# Patient Record
Sex: Female | Born: 1960 | Race: White | Hispanic: No | Marital: Married | Smoking: Never smoker
Health system: Southern US, Academic
[De-identification: ages and names within clinical notes are randomized; demographics above are authoritative.]

## PROBLEM LIST (undated history)

## (undated) DIAGNOSIS — I10 Essential (primary) hypertension: Secondary | ICD-10-CM

## (undated) DIAGNOSIS — J383 Other diseases of vocal cords: Secondary | ICD-10-CM

## (undated) DIAGNOSIS — E782 Mixed hyperlipidemia: Secondary | ICD-10-CM

## (undated) DIAGNOSIS — R011 Cardiac murmur, unspecified: Secondary | ICD-10-CM

## (undated) DIAGNOSIS — F419 Anxiety disorder, unspecified: Secondary | ICD-10-CM

## (undated) DIAGNOSIS — D649 Anemia, unspecified: Secondary | ICD-10-CM

## (undated) DIAGNOSIS — E559 Vitamin D deficiency, unspecified: Secondary | ICD-10-CM

## (undated) DIAGNOSIS — J40 Bronchitis, not specified as acute or chronic: Secondary | ICD-10-CM

## (undated) HISTORY — DX: Vitamin D deficiency, unspecified: E55.9

## (undated) HISTORY — DX: Cardiac murmur, unspecified: R01.1

## (undated) HISTORY — DX: Essential (primary) hypertension: I10

## (undated) HISTORY — PX: ANAL FISTULOTOMY: SHX1139

## (undated) HISTORY — DX: Other diseases of vocal cords: J38.3

## (undated) HISTORY — PX: HX APPENDECTOMY: SHX54

## (undated) HISTORY — DX: Anxiety disorder, unspecified: F41.9

## (undated) HISTORY — DX: Mixed hyperlipidemia: E78.2

## (undated) HISTORY — DX: Anemia, unspecified: D64.9

## (undated) HISTORY — PX: HX TONSILLECTOMY: SHX27

## (undated) HISTORY — PX: ENDOMETRIAL ABLATION W/ NOVASURE: SUR434

## (undated) HISTORY — PX: HX TUBAL LIGATION: SHX77

## (undated) HISTORY — PX: KNEE ARTHROSCOPY: SUR90

## (undated) HISTORY — DX: Bronchitis, not specified as acute or chronic: J40

---

## 1992-06-29 ENCOUNTER — Other Ambulatory Visit (HOSPITAL_COMMUNITY): Payer: Self-pay

## 2010-10-10 ENCOUNTER — Ambulatory Visit (INDEPENDENT_AMBULATORY_CARE_PROVIDER_SITE_OTHER): Payer: BC Managed Care – PPO | Admitting: Otolaryngology

## 2010-10-13 ENCOUNTER — Encounter (INDEPENDENT_AMBULATORY_CARE_PROVIDER_SITE_OTHER): Payer: Self-pay | Admitting: Otolaryngology

## 2010-10-13 ENCOUNTER — Ambulatory Visit: Payer: BC Managed Care – PPO | Attending: Otolaryngology | Admitting: Otolaryngology

## 2010-10-13 VITALS — BP 132/82 | HR 59 | Temp 98.2°F | Ht 65.0 in | Wt 185.0 lb

## 2010-10-13 DIAGNOSIS — J387 Other diseases of larynx: Secondary | ICD-10-CM | POA: Insufficient documentation

## 2010-10-13 NOTE — H&P (Addendum)
PATIENT NAME:  Courtney Maynard  MRN:  [id number]  DOB:  12-07-1960  DATE OF SERVICE: 10/13/2010    Chief Complaint:  Elpidio Eric      HPI:  Courtney Maynard is a 50 y.o. 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 Noelle Penner for the past twelve years. Dr. Noelle Penner is moving his practice to the [place] and she needs to find a new physician to provide the injections. She has been getting the injections approximately every 4 months. She had previously been getting injections every 6 months. Her most recent injection was in 05/2010. She has tried speech therapy in the past for this but benefits most from the botox injections. She has had a tonsillectomy in the past but has not had any other surgery on her head and neck. She has no other complaints at this time.        Past Medical History:  History reviewed. No pertinent past medical history.    Past Surgical History:  Past Surgical History   Procedure Date   . Hx tonsillectomy        Family History:  Family History   Problem Relation Age of Onset   . Heart Disease Mother    . Hypertension Mother    . Cancer Father    . Diabetes Father    . Heart Disease Father    . Hypertension Father    . Cancer Sister    . Diabetes Sister    . Hypertension Sister    . Cancer Brother    . Heart Disease Brother    . Hypertension Brother        Social History:  History   Smoking status   . Never Smoker    Smokeless tobacco   . Never Used     History   Alcohol Use No     Social History     Occupational History   . Not on file.       Medications:  Outpatient Prescriptions Marked as Taking for the 10/13/10 encounter (Office Visit) with Festus Holts, MD   Medication Sig   . Phentermine 37.5 mg Oral Capsule take 37.5 mg by mouth Once a day.     . MULTIVIT WITH CALCIUM,IRON,MIN [facility] MULTIPLE VITAMINS ORAL) take  by mouth.      . Fexofenadine (ALLEGRA) 30 mg Oral Tablet take 180 mg by mouth Twice daily.     . Naproxen 500 mg Oral Tablet, Delayed Release (E.C.) take 500 mg by mouth Twice daily.         Allergies:  No Known Allergies    Review of Systems:  Do you have any fevers: no   Any weight change: yes Explain Weight Change: diet Change in your vision: no    Chest Pain: no   Shortness of Breath: no   Stomach pain: no   Urinary difficulity: no   Joint Pain: yes Explain Joint Pain: right foot Skin Problems: no   Weakness or Numbness: no   Easy Bruising or Bleeding: no   Excessive Thirst: no   Seasonal Allergies: yes Explain Seasonal Allergies: all  All other systems reviewed and found to be negative.    Physical Exam:  Blood pressure 132/82, pulse 59, temperature 36.8 C (98.2 F), height 1.651 m (5\' 5" ), weight 83.915 kg (185 lb).  Body mass index is 30.79 kg/(m^2).  General Appearance: Pleasant, cooperative, healthy,  and in no acute distress.  Eyes: Conjunctivae/corneas clear  Head and Face: Normocephalic, atraumatic.  Face symmetric, no obvious lesions.   Pinnae: Normal shape and position.   External auditory canals:  Patent without inflammation.  Tympanic membranes:  Intact, translucent, midposition, middle ear aerated.  Nose:  External pyramid midline. Septum midline. Mucosa normal. No purulence, polyps, or crusts.   Oral Cavity/Oropharynx: No mucosal lesions, masses, or pharyngeal asymmetry.  Hypopharynx/Larynx: See procedure note  Neck:  No palpable thyroid, salivary gland, or neck masses.  Heme/Lymph:  No cervical adenopathy.  Cardiovascular:  Good perfusion of upper extremities.  No cyanosis of the hands or fingers.  Lungs: No apparent stridorous breathing. No acute distress.  Skin: Skin warm and dry.  Neurologic: Cranial nerves:  grossly intact.      Procedure:  OTOLARYGOLOGY PROCEDURE NOTE      DATE OF SERVICE: 10/13/2010  PATIENT NAME:Angelia Joyce Gross Wyant  DATE OF BIRTH: 10/13/1960       Procedure:  Fiberoptic Trans-nasal Laryngoscopy  Operator: Hassell Done  Asst: Jenean Lindau  Anesthesia:  Topical  Findings: Visualization of the hypopharynx/larynx with mirror not adequate for examination and fiberoptic examination performed.  After the nasal cavity was anesthetized/decongested with topical pontocaine and neosynephrine, flexible laryngoscope was passed.     Nasopharynx had normal mucosa and no lesions of the nasopharyngeal walls.  The Eustachian tube orifices were normal.     Hypopharynx:  No lesions of the epiglottis, posterior pharyngeal walls or piriform mucosa.  There is no pooling of secretions.    Larynx: The vocal cords are mobile and adduct to midline bilaterally. There is tremor of the vocal cords when the patient makes the "e" sound. There are no lesions.   There is good abduction with sniff.  The airway is patent.          Data Reviewed: Medical records from Dr. Noelle Penner' office indicate that her last injection was on 06/10/2010 and consisted of 1.25 Units of botox in 0.1 cc into each vocal cord      Assessment:  1. 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 FLEX LARYNGOSCOPY DIAGNOSTIC (AMB ONLY)       1. She wishes to establish care with [place] for botox injections to manage her spasmodic dysphonia. She wishes to have the injections approved through her insurance company before she starts. We will have her meet with our insurance people and tentatively schedule her  2. We will plan to see her back on the next botox injection day pending approval with her insurance        Preston Fleeting, MD  PGY-2  [place] [facility] of Otolaryngology  Head and Neck Surgery    See resident's note for details. I saw and examined the patient and agree with the resident's findings and plan as written except as noted  and: I was present and supervised/observed the entire laryngoscopy procedure.    Festus Holts, MD 10/17/2010, 3:37 PM    [place] [facility] of Otolaryngology  Head and Neck Surgery

## 2010-10-13 NOTE — Procedures (Addendum)
OTOLARYGOLOGY PROCEDURE NOTE      DATE OF SERVICE: 10/13/2010  PATIENT NAME:Courtney Maynard  DATE OF BIRTH: [birth year]      Procedure:  Fiberoptic Trans-nasal Laryngoscopy  Operator: Hassell Done  Asst: Jenean Lindau  Anesthesia:  Topical  Findings: Visualization of the hypopharynx/larynx with mirror not adequate for examination and fiberoptic examination performed.  After the nasal cavity was anesthetized/decongested with topical pontocaine and neosynephrine, flexible laryngoscope was passed.     Nasopharynx had normal mucosa and no lesions of the nasopharyngeal walls.  The Eustachian tube orifices were normal.     Hypopharynx:  No lesions of the epiglottis, posterior pharyngeal walls or piriform mucosa.  There is no pooling of secretions.    Larynx: The vocal cords are mobile and adduct to midline bilaterally. There is tremor of the vocal cords when the patient makes the "e" sound. There are no lesions.   There is good abduction with sniff.  The airway is patent.      I was present and supervised/observed the entire procedure.  Festus Holts, MD 10/17/2010, 3:37 PM

## 2010-10-17 ENCOUNTER — Ambulatory Visit: Payer: BC Managed Care – PPO | Attending: Otolaryngology | Admitting: Otolaryngology

## 2010-10-17 DIAGNOSIS — R490 Dysphonia: Secondary | ICD-10-CM | POA: Insufficient documentation

## 2010-10-17 DIAGNOSIS — J387 Other diseases of larynx: Secondary | ICD-10-CM | POA: Insufficient documentation

## 2010-10-17 NOTE — Procedures (Addendum)
OTOLARYGOLOGY PROCEDURE NOTE      DATE OF SERVICE: 10/17/2010  PATIENT NAME:Courtney Maynard  DATE OF BIRTH: [birth year]        Procedure:       Laryngeal Botox Injection    Pre/Postoperative Diagnosis: Spasmodic Dysphonia    Surgeon:         Festus Holts, MD    Procedure: Patient was brought into the Botox Suite and placed in semi-recumbent position.  The head was placed in a slightly extended position.  All landmarks were easily palpated and the skin was prepped with alcohol.  0.4mL of 1% lidocaine with 1:100,000 Epinephrine was injected to the subcutaneous smin over the cricothyroid membrane and through the membrane into the subglottic airway.  Using a 27-gauge Teflon coated needle under EMG guidance, 1.25 units of Botox in 0.05  mL were injected into the Right TA muscle.  Good EMG signal was obtained.  Next, 1.25 Units of Botox in 0.05 mL were injected into the Left TA muscle.  Again, good EMG signal was obtained.  The patient tolerated the procedure well.   All discharge instructions were thoroughly explained to the patient and all questions answered.      Lot # [id number]  Exp. 03/2013    Festus Holts, MD 10/17/2010, 2:53 PM

## 2010-10-17 NOTE — Progress Notes (Signed)
Courtney Maynard is a 50 y.o. female who returns for Botox injection in management of her spasmodic dysphonia.  She had been managed previously with 1.25 Units bilaterally in [place].  Will procede with first injection with [place] today.    Festus Holts, MD 10/17/2010, 2:50 PM

## 2011-01-09 ENCOUNTER — Ambulatory Visit: Payer: BC Managed Care – PPO | Attending: Otolaryngology | Admitting: Otolaryngology

## 2011-01-09 DIAGNOSIS — R490 Dysphonia: Secondary | ICD-10-CM | POA: Insufficient documentation

## 2011-01-09 MED ORDER — LEVOFLOXACIN 500 MG TABLET
500.0000 mg | ORAL_TABLET | Freq: Every day | ORAL | Status: DC
Start: 2011-01-09 — End: 2012-02-19

## 2011-01-09 NOTE — Procedures (Addendum)
OTOLARYGOLOGY PROCEDURE NOTE      DATE OF SERVICE: 01/09/2011  PATIENT NAME:Courtney Maynard  DATE OF BIRTH: September 25, 1960        Procedure:       Laryngeal Botox Injection    Pre/Postoperative Diagnosis: Spasmodic Dysphonia    Surgeon:         Festus Holts, MD    Procedure: Patient was brought into the Botox Suite and placed in semi-recumbent position.  The head was placed in a slightly extended position.  All landmarks were easily palpated and the skin was prepped with alcohol.   The skin over the cricothyroid membrane and the airway were anesthetized with a total of 0.42mL 1% lido with 1:100,000 epi.  Using a 27-gauge Teflon coated needle under EMG guidance, 1.25 units of Botox in 0.05 mL were injected into the Right TA muscle.  Good EMG signal was obtained.  Next, 1.25 Units of Botox in 0.05 mL were injected into the Left TA muscle.  Again, good EMG signal was obtained.  The patient tolerated the procedure well.   All discharge instructions were thoroughly explained to the patient and all questions answered.    Lot#   [id number]  Exp: April 2015    Festus Holts, MD 01/09/2011, 1:39 PM

## 2011-01-09 NOTE — Progress Notes (Signed)
CC: Return for botox injection - spasmodic dysphonia   Sinus infection    HPI: [name] is a 50 y.o. female who had her most recent botox injection on 10/17/2010.  She was not as breathy afterward and it didn't last as long as she is used to.  She feels like she could have gotten an injection last month.  She also notes a recent problem with sinus symptoms.  This was mostly facial pressure and pain and typical for prior sinus infections she has had.  This started after smoke exposure.  She was treated with Augmentin and the Z-pack without improvement.  No other new complaints.  She does take flonase and allegra.    No past medical history on file.     Past Surgical History   Procedure Date   . Hx tonsillectomy       Current Outpatient Prescriptions   Medication Sig   . MULTIVIT WITH CALCIUM,IRON,MIN [facility] MULTIPLE VITAMINS ORAL) take  by mouth.     . Fexofenadine (ALLEGRA) 30 mg Oral Tablet take 180 mg by mouth Twice daily.     . Naproxen 500 mg Oral Tablet, Delayed Release (E.C.) take 500 mg by mouth Twice daily.     Marland Kitchen DISCONTD: Phentermine 37.5 mg Oral Capsule take 37.5 mg by mouth Once a day.        No Known Allergies     Exam:  BP 126/80   Pulse 96   Temp 37 C (98.6 F)   Ht 1.651 m (5\' 5" )   Wt 86.637 kg (191 lb)   BMI 31.78 kg/m2   General: Age appropriate female seated and in no acute distress.   Vocal tremor appreciated.   Head:  Atraumatic, normocephalic, no lesions   Eyes:  Palpebral fissures equal bilaterally, sclerae anicteric.   Nose:  No external lesions.  The nares are patent.  The septum is intact.  No polyps or purulence noted.  Oral Cavity: No lesions of the gingiva, floor of mouth, tongue or palate.     Oropharynx: No lesions of the tongue base, soft palate, or pharyngeal walls.  No postnasal drainage.      Impression: Spasmodic dysphonia    Maxillary sinusitis     Plan:  For botox injection today.  Will keep 1.25 Unit dose.  If same effect after this injection would consider increase.  Will prescribe 10 days of levaquin with one refill if needed.  If not improving will consider imaging.    Festus Holts, MD 01/09/2011, 1:34 PM

## 2011-01-16 ENCOUNTER — Ambulatory Visit (INDEPENDENT_AMBULATORY_CARE_PROVIDER_SITE_OTHER): Payer: BC Managed Care – PPO | Admitting: Otolaryngology

## 2011-02-02 ENCOUNTER — Ambulatory Visit (INDEPENDENT_AMBULATORY_CARE_PROVIDER_SITE_OTHER): Payer: Self-pay | Admitting: Otolaryngology

## 2011-02-02 NOTE — Telephone Encounter (Signed)
Message copied by Lauro Regulus on Thu Feb 02, 2011  4:16 PM  ------       Message from: ICE, SHERRY       Created: Thu Feb 02, 2011  3:07 PM       Regarding: RE: PT EXPERIENCING SPEECH LOOSE. HAD INJECTION 01/09/11         >> SHERRY ICE 02/02/2011 03:07 PM       DR. MCCHESNEY PT              PT CALLED AND STATES THAT THE INJECTION SHE HAD ON December 10TH, SEEMS NOT TO BE WORKING AS WELL AS LAST ONE . THE PT IS EXPERIENCING PROBLEMS WITH SPEECH LOOSE                     PLEASE CALL PT BACK TO DISCUSS WHAT TO DO              THANKS       SHERRY

## 2011-02-02 NOTE — Telephone Encounter (Signed)
Dr. Hassell Done had me take to [facility] to get her scheduled for another injection.

## 2011-02-20 ENCOUNTER — Ambulatory Visit: Payer: BC Managed Care – PPO | Attending: Otolaryngology | Admitting: Otolaryngology

## 2011-02-20 DIAGNOSIS — R490 Dysphonia: Secondary | ICD-10-CM | POA: Insufficient documentation

## 2011-02-20 DIAGNOSIS — J387 Other diseases of larynx: Secondary | ICD-10-CM | POA: Insufficient documentation

## 2011-02-20 NOTE — Procedures (Signed)
OTOLARYGOLOGY PROCEDURE NOTE      DATE OF SERVICE: 02/20/2011  PATIENT NAME:Courtney Maynard  DATE OF BIRTH: 08/23/1960        Procedure:       Laryngeal Botox Injection    Pre/Postoperative Diagnosis: Spasmodic Dysphonia    Surgeon:         Festus Holts, MD    Procedure: Patient was brought into the Botox Suite and placed in semi-recumbent position.  The head was placed in a slightly extended position.  All landmarks were easily palpated and the skin was prepped with alcohol.  0.9ml of 1% lidocaine with 1:100,000 epinephrine was injected into the skin overlying the CT membrane and into the glottic airway.  Using a 27-gauge Teflon coated needle under EMG guidance, 1.5 units of Botox in 0.06 mL were injected into the Right TA muscle.  Good EMG signal was obtained.  Next, 1.5 Units of Botox in 0.06 mL were injected into the Left TA muscle.  Again, good EMG signal was obtained.  The patient tolerated the procedure well.   All discharge instructions were thoroughly explained to the patient and all questions answered.    Lot [id number]  Expiration May 2015    Festus Holts, MD 02/20/2011, 2:22 PM

## 2011-02-20 NOTE — Progress Notes (Signed)
CC: Spasmodic Dysphonia    HPI: Courtney Maynard is a 51 y.o. female with a history of spasmodic dysphonia.  She had been doing well with 1.25 Units to each TA muscle in [place].  We have injected her twice at this dose without normal duration of results.  She really had little effect after her injection one month ago.    No past medical history on file.     Past Surgical History   Procedure Date   . Hx tonsillectomy       Current Outpatient Prescriptions   Medication Sig   . Phentermine 37.5 mg Oral Capsule take 37.5 mg by mouth Once a day.     . levofloxacin (LEVAQUIN) 500 mg Oral Tablet take 1 Tab by mouth Once a day.   . MULTIVIT WITH CALCIUM,IRON,MIN [facility] MULTIPLE VITAMINS ORAL) take  by mouth.     . Fexofenadine (ALLEGRA) 30 mg Oral Tablet take 180 mg by mouth Twice daily.     . Naproxen 500 mg Oral Tablet, Delayed Release (E.C.) take 500 mg by mouth Twice daily.        No Known Allergies       EXAM:    Visit Vitals   Item Reading   . BP 146/92   . Pulse 83   . Temp 36.5 C (97.7 F)   . Ht 1.651 m (5\' 5" )   . Wt 89.812 kg (198 lb)   . BMI 32.95 kg/m2      General: Age appropriate female seated and in no acute distress   Voice is somewhat strained and rough.    Impression: Spasmodic Dysphonia    Plan:  For injection today.  Will plan 1.5 Units bilaterally.    Festus Holts, MD 02/20/2011, 2:20 PM

## 2011-04-17 ENCOUNTER — Ambulatory Visit (INDEPENDENT_AMBULATORY_CARE_PROVIDER_SITE_OTHER): Payer: BC Managed Care – PPO | Admitting: Otolaryngology

## 2011-05-15 ENCOUNTER — Ambulatory Visit: Payer: BC Managed Care – PPO | Attending: Otolaryngology | Admitting: Otolaryngology

## 2011-05-15 VITALS — BP 142/82 | HR 67 | Temp 98.7°F | Ht 66.18 in | Wt 195.1 lb

## 2011-05-15 DIAGNOSIS — J387 Other diseases of larynx: Secondary | ICD-10-CM | POA: Insufficient documentation

## 2011-05-15 NOTE — Progress Notes (Signed)
Courtney Maynard is a 51 y.o. female who presents for laryngeal botox injection for spasmodic dysphonia.   The last injection was 1.5 units to both cords performed on 02/20/11.  This had been an increase in dose from 1.25 since this was not as effective as she would like.  She had breathiness for over 1 after the injection.  Will reduce dose to 1.25 Units.  Will proceed with botox injection.    Festus Holts, MD 05/15/2011, 2:20 PM

## 2011-05-15 NOTE — Procedures (Signed)
OTOLARYGOLOGY PROCEDURE NOTE      DATE OF SERVICE: 05/15/2011  PATIENT NAME:Courtney Maynard  DATE OF BIRTH: 1960/05/22        Procedure:       Laryngeal Botox Injection    Pre/Postoperative Diagnosis: Spasmodic Dysphonia    Surgeon:         Festus Holts, MD    Procedure: Patient was brought into the Botox Suite and placed in semi-recumbent position.  The head was placed in a slightly extended position.  All landmarks were easily palpated and the skin was prepped with alcohol.  0.3 mL of 1% lidocaine with 1:100,000 epinephrine was injected into the skin overlying the cricothyroid membrane and into the subglottic airway.  Using a 27-gauge Teflon coated needle under EMG guidance, 1.25 units of Botox in 0.05 mL were injected into the right TA muscle.  Good EMG signal was obtained.  Next, 1.25 Units of Botox in 0.05 mL were injected into the Left TA muscle.  Again, good EMG signal was obtained.  The patient tolerated the procedure well.   All discharge instructions were thoroughly explained to the patient and all questions answered.    Lot# [id number]  Exp: Aug 2015    Festus Holts, MD 05/15/2011, 2:28 PM

## 2011-08-14 ENCOUNTER — Ambulatory Visit: Payer: BC Managed Care – PPO | Attending: Otolaryngology | Admitting: Otolaryngology

## 2011-08-14 VITALS — BP 136/86 | HR 75 | Temp 98.2°F | Ht 64.96 in | Wt 194.2 lb

## 2011-08-14 DIAGNOSIS — J387 Other diseases of larynx: Secondary | ICD-10-CM | POA: Insufficient documentation

## 2011-08-14 NOTE — Procedures (Signed)
 OTOLARYGOLOGY PROCEDURE NOTE      DATE OF SERVICE: 08/14/2011  PATIENT NAME:Courtney Maynard  DATE OF BIRTH: 07/10/1960        Procedure:       Laryngeal Botox  Injection    Pre/Postoperative Diagnosis: Spasmodic Dysphonia    Surgeon:         Selinda MYRTIS Aloe, MD    Procedure: Patient was brought into the Botox  Suite and placed in semi-recumbent position.  The head was placed in a slightly extended position.  All landmarks were easily palpated and the skin was prepped with alcohol.  0.25mL of 1% lidocaine with 1:100,000 epinephrine was injected into the skin overlying the cricothyroid membrane and into the subglottic airway.  Using a 27-gauge Teflon coated needle under EMG guidance, 1.25 units of Botox  in 0.05 mL were injected into the RightTA muscle.  Good EMG signal was obtained.  Next, 1.25 Units of Botox  in 0.05 mL were injected into the Left TA muscle.  Again, good EMG signal was obtained.  The patient tolerated the procedure well.   All discharge instructions were thoroughly explained to the patient and all questions answered.    Selinda SHAUNNA Aloe, MD 08/14/2011, 1:32 PM

## 2011-08-14 NOTE — Progress Notes (Signed)
Rafael Bihari is a 51 y.o. female who presents for laryngeal botox injection for spasmodic dysphonia.   The last injection was 1.25 units to both cords performed on 05/15/11.  This had been a decrease in dose from 1.5 due to prolonged breathiness. Will proceed with botox injection at same dose 1.25Units bilaterally.    Festus Holts, MD 08/14/2011, 1:29 PM

## 2011-11-20 ENCOUNTER — Ambulatory Visit: Payer: BC Managed Care – PPO | Attending: Otolaryngology | Admitting: Otolaryngology

## 2011-11-20 VITALS — BP 126/78 | HR 72 | Temp 98.2°F | Ht 65.0 in | Wt 196.0 lb

## 2011-11-20 DIAGNOSIS — R490 Dysphonia: Secondary | ICD-10-CM | POA: Insufficient documentation

## 2011-11-20 DIAGNOSIS — J387 Other diseases of larynx: Secondary | ICD-10-CM | POA: Insufficient documentation

## 2011-11-20 NOTE — Procedures (Signed)
OTOLARYGOLOGY PROCEDURE NOTE      DATE OF SERVICE: 11/20/2011  PATIENT NAME:Courtney Maynard  DATE OF BIRTH: 1961/01/23        Procedure:       Laryngeal Botox Injection    Pre/Postoperative Diagnosis: Spasmodic Dysphonia    Surgeon:         Festus Holts, MD    Procedure: Patient was brought into the Botox Suite and placed in semi-recumbent position.  The head was placed in a slightly extended position.  All landmarks were easily palpated and the skin was prepped with alcohol.  0.3 of 1% lidocaine with 1:100,000 epinephrine was injected into the skin overlying the cricothyroid membrane and into the subglottic airway.  Using a 27-gauge Teflon coated needle under EMG guidance, 1.25 units of Botox in 0.05 mL were injected into the Right TA muscle.  Good EMG signal was obtained.  Next, 1.25 Units of Botox in 0.05 mL were injected into the Left TA muscle.  Again, good EMG signal was obtained.  The patient tolerated the procedure well.   All discharge instructions were thoroughly explained to the patient and all questions answered.    See nursing notes for Expiration date and Lot #.    Festus Holts, MD 11/20/2011, 2:39 PM

## 2011-11-20 NOTE — Progress Notes (Signed)
Courtney Maynard is a 51 y.o. female who presents for laryngeal botox injection for spasmodic dysphonia.   The last injection was 1.25 units to both cords performed on 08/14/2011.  She had  prolonged breathiness at her previous dose of 1.5 Units bilaterally. Will proceed with botox injection at same dose 1.25Units bilaterally.    Festus Holts, MD 11/20/2011, 2:36 PM

## 2012-02-19 ENCOUNTER — Ambulatory Visit: Payer: BC Managed Care – PPO | Attending: Otolaryngology | Admitting: Otolaryngology

## 2012-02-19 VITALS — BP 128/70 | HR 88 | Temp 98.6°F | Wt 201.5 lb

## 2012-02-19 DIAGNOSIS — J387 Other diseases of larynx: Secondary | ICD-10-CM | POA: Insufficient documentation

## 2012-02-19 NOTE — Procedures (Addendum)
 OTOLARYGOLOGY PROCEDURE NOTE      DATE OF SERVICE: 02/19/2012  PATIENT NAME:Courtney Maynard  DATE OF BIRTH: [birth year]        Procedure:       Laryngeal Botox  Injection    Pre/Postoperative Diagnosis: Spasmodic Dysphonia    Surgeon:         Selinda MYRTIS Aloe, MD    Procedure: Patient was brought into the Botox  Suite and placed in semi-recumbent position.  The head was placed in a slightly extended position.  All landmarks were easily palpated and the skin was prepped with alcohol.  0.6 of 1% lidocaine with 1:100,000 epinephrine was injected into the skin overlying the cricothyroid membrane and into the subglottic airway.  Using a 27-gauge Teflon coated needle under EMG guidance, 1.25 units of Botox  in 0.05 mL were injected into the right TA muscle.  Good EMG signal was obtained.  Next, 1.25 Units of Botox  in 0.05 mL were injected into the left TA muscle.  Again, good EMG signal was obtained.  The patient tolerated the procedure well.   All discharge instructions were thoroughly explained to the patient and all questions answered.    See nursing notes for Expiration date and Lot #.    [place] ONEIDA Gottron, MD 02/19/2012, 1:45 PM      I was present and performed the entire procedure.  Selinda SHAUNNA Aloe, MD 02/20/2012, 9:45 AM

## 2012-03-18 ENCOUNTER — Ambulatory Visit: Payer: BC Managed Care – PPO | Attending: Otolaryngology | Admitting: Otolaryngology

## 2012-03-18 VITALS — BP 150/86 | HR 100 | Temp 97.2°F | Wt 201.1 lb

## 2012-03-18 DIAGNOSIS — J387 Other diseases of larynx: Secondary | ICD-10-CM | POA: Insufficient documentation

## 2012-03-18 NOTE — Progress Notes (Signed)
CC: Hoarseness, Spasmodic dysphonia    HPI: Courtney Maynard is a 52 y.o. female with a history of spasmodic dysphonia.  She was last injected on 01/15/2012, but has not noted any change in her voice since.  She returns for injection    No past medical history on file.     Past Surgical History   Procedure Laterality Date   . Hx tonsillectomy        Current Outpatient Prescriptions   Medication Sig   . MULTIVIT WITH CALCIUM,IRON,MIN [facility] MULTIPLE VITAMINS ORAL) take  by mouth.     . Naproxen 500 mg Oral Tablet, Delayed Release (E.C.) take 500 mg by mouth Twice daily.        No Known Allergies     EXAM: BP 150/86   Pulse 100   Temp(Src) 36.2 C (97.2 F)   Wt 91.2 kg (201 lb 1 oz)   BMI 33.46 kg/m2   General: Age appropriate female seated and in no acute distress   Head:  Atraumatic, normocephalic, no lesions   Eyes:  Palpebral fissures equal bilaterally, sclerae anicteric.   Neck:  Supple, non-tender, no lymphadenopathy.  Trachea midline.  Thyroid without masses or nodules.     Impression:  Spasmodic dysphonia    Plan:  I believe there was a technical issue with her last injection related to the equipment.  Repeat injection will be performed at the same dose.  She will return in 3 months.    Festus Holts, MD 03/18/2012, 1:39 PM

## 2012-03-18 NOTE — Procedures (Signed)
OTOLARYGOLOGY PROCEDURE NOTE      DATE OF SERVICE: 03/18/2012  PATIENT NAME:Courtney Maynard  DATE OF BIRTH: Apr 30, 1960        Procedure:       Laryngeal Botox Injection    Pre/Postoperative Diagnosis: Spasmodic Dysphonia    Surgeon:         Festus Holts, MD    Procedure: Patient was brought into the Botox Suite and placed in semi-recumbent position.  The head was placed in a slightly extended position.  All landmarks were easily palpated and the skin was prepped with alcohol.  0.3 of 1% lidocaine with 1:100,000 epinephrine was injected into the skin overlying the cricothyroid membrane and into the subglottic airway.  Using a 27-gauge Teflon coated needle under EMG guidance, 1.5 units of Botox in 0.06 mL were injected into the Right TA muscle.  Good EMG signal was obtained.  Next, 1.5 Units of Botox in 0.06 mL were injected into the Left TA muscle.  Again, good EMG signal was obtained.  The patient tolerated the procedure well.   All discharge instructions were thoroughly explained to the patient and all questions answered.    See nursing notes for Expiration date and Lot #.    Festus Holts, MD 03/18/2012, 1:41 PM

## 2012-05-20 ENCOUNTER — Ambulatory Visit (INDEPENDENT_AMBULATORY_CARE_PROVIDER_SITE_OTHER): Payer: BC Managed Care – PPO | Admitting: Otolaryngology

## 2012-06-17 ENCOUNTER — Ambulatory Visit: Payer: BC Managed Care – PPO | Attending: Otolaryngology | Admitting: Otolaryngology

## 2012-06-17 ENCOUNTER — Ambulatory Visit (INDEPENDENT_AMBULATORY_CARE_PROVIDER_SITE_OTHER): Payer: BC Managed Care – PPO | Admitting: Otolaryngology

## 2012-06-17 VITALS — BP 126/74 | HR 97 | Temp 98.6°F | Ht 64.96 in | Wt 206.1 lb

## 2012-06-17 DIAGNOSIS — J387 Other diseases of larynx: Secondary | ICD-10-CM | POA: Insufficient documentation

## 2012-06-17 NOTE — Progress Notes (Addendum)
PATIENT NAME:  Courtney Maynard  MRN:  [id number]  DOB:  01-05-1961  DATE OF SERVICE: 06/17/2012    HPI:  Courtney Maynard is a 52 y.o. year old female who presents for follow up of spasmodic dysphonia.  She states the last 3 weeks she's felt like she needs another injection.  She was injected with 1.25 units bilaterally last time on 03/08/2012.  However, in general, she feels it hasn't lasted as long.  She does note that her voice does well with a good result after the injection.    Physical Exam:  Blood pressure 126/74, pulse 97, temperature 37 C (98.6 F), height 1.65 m (5' 4.96"), weight 93.5 kg (206 lb 2.1 oz).  Body mass index is 34.34 kg/(m^2).  General Appearance: Pleasant, cooperative, healthy, and in no acute distress.  Eyes: Conjunctivae/corneas clear, PERRLA, EOM's intact.  Head and Face: Normocephalic, atraumatic.  Pinnae: Normal shape and position.   Nose:  External pyramid midline.  Neck:  No palpable thyroid, salivary gland, or neck masses.  Landmarks palpable.      Procedure:    OTOLARYGOLOGY PROCEDURE NOTE      DATE OF SERVICE: 06/17/2012  PATIENT NAME:Courtney Maynard  DATE OF BIRTH: Oct 06, 1960        Procedure:       Laryngeal Botox Injection    Pre/Postoperative Diagnosis: Spasmodic Dysphonia    Surgeon:         Festus Holts, MD     Procedure: Patient was brought into the Botox Suite and placed in semi-recumbent position.  The head was placed in a slightly extended position.  All landmarks were easily palpated and the skin was prepped with alcohol.  0.8 mL of 1% lidocaine with 1:100,000 epinephrine was injected into the skin overlying the cricothyroid membrane and into the subglottic airway.  Using a 27-gauge Teflon coated needle under EMG guidance, 1.25 units of Botox in 0.05 mL were injected into the right TA muscle.  Good EMG signal was obtained.  Next, 1.25 Units of Botox in 0.05 mL were injected into the left TA muscle.  Again, good EMG signal was obtained.  The patient tolerated the procedure well.   All discharge instructions were thoroughly explained to the patient and all questions answered.    See nursing notes for Expiration date and Lot #.    Rance Muir, MD 06/17/2012, 2:13 PM              Assessment:  1. Spasmodic dysphonia        Plan:  Orders Placed This Encounter   . BOTOX INJECTION (ENT AMB ONLY) (AMB ONLY)     1.  Injected with 1.25 units bilaterally today.  We discussed going to 1.5 units today, but the patient states that made her breathy for too long and would like to stick with 1.25 today and consider increasing only if this does not work as long again.    2.  Follow up in Botox clinic in 3 months    Rance Muir, MD 06/17/2012, 2:14 PM      Late entry for 06/17/12. I saw and examined the patient.  I reviewed the resident's note.  I agree with the findings and plan of care as documented in the resident's note.  Any exceptions/additions are edited/noted.    Festus Holts, MD 06/18/2012, 10:50 AM   Assistant Professor  [facility]     PCP:  Dewaine Conger, MD  [address] [facility]  [address] [address]  [place] [address]   REF:  Dewaine Conger, MD  [address] [place], [place] [address]

## 2012-09-16 ENCOUNTER — Ambulatory Visit (INDEPENDENT_AMBULATORY_CARE_PROVIDER_SITE_OTHER): Payer: BC Managed Care – PPO | Admitting: Otolaryngology

## 2012-11-23 ENCOUNTER — Other Ambulatory Visit: Payer: Self-pay

## 2012-11-25 ENCOUNTER — Other Ambulatory Visit: Payer: Self-pay

## 2013-10-31 ENCOUNTER — Other Ambulatory Visit: Payer: Self-pay

## 2014-11-07 ENCOUNTER — Other Ambulatory Visit: Payer: Self-pay

## 2015-11-13 ENCOUNTER — Other Ambulatory Visit: Payer: Self-pay

## 2020-02-11 ENCOUNTER — Other Ambulatory Visit (HOSPITAL_COMMUNITY): Payer: Self-pay

## 2020-02-11 LAB — EXTERNAL COVID-19 MOLECULAR RESULT: External 2019-n-CoV/SARS-CoV-2: POSITIVE — AB

## 2020-10-09 IMAGING — MR MRI LUMBAR SPINE WITHOUT CONTRAST
5 of 6 series · 32 of 48 positions shown · IV contrast (gadolinium)
Comparison: None.

﻿EXAM:  00492   MRI LUMBAR SPINE WITHOUT CONTRAST
INDICATION: Chronic lower back pain with right lower extremity radiculopathy.
TECHNIQUE: Multiplanar, multisequential MRI of the lumbar spine was performed without gadolinium contrast.

[Series 5: T2 · sagittal · 4.0mm · 0.94mm/px · 6 of 13 slices shown (1 of 3)]
[im 1/13]
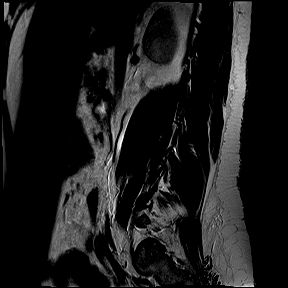
[im 3/13]
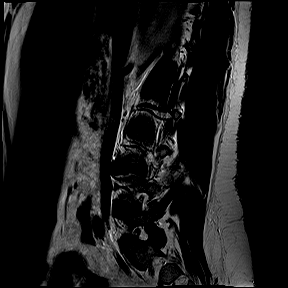
[im 5/13]
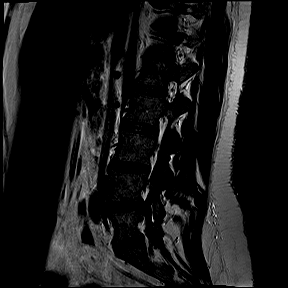
[im 8/13]
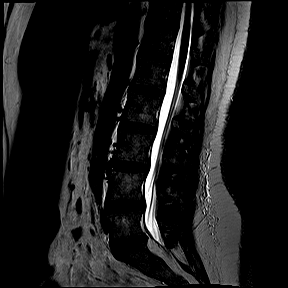
[im 10/13]
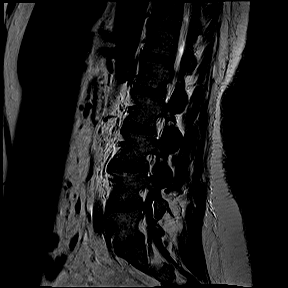
[im 13/13]
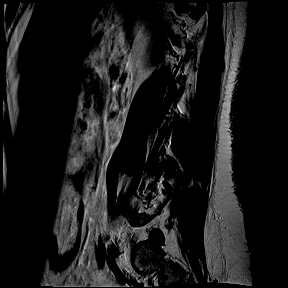

[Series 6: T1 · sagittal · 4.0mm · 0.94mm/px · 6 of 13 slices shown (1 of 2)]
[im 1/13]
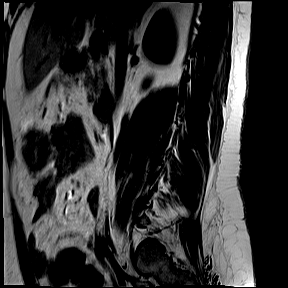
[im 3/13]
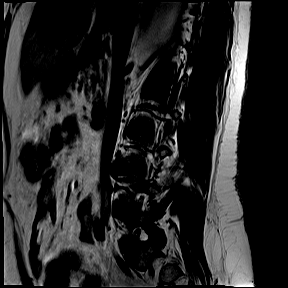
[im 5/13]
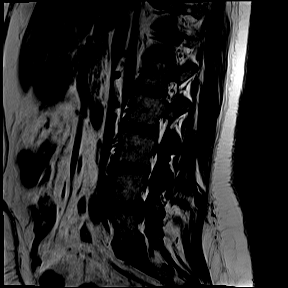
[im 8/13]
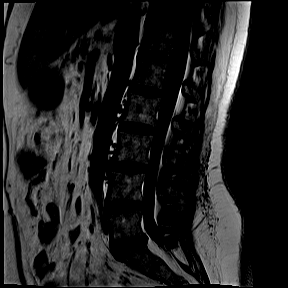
[im 10/13]
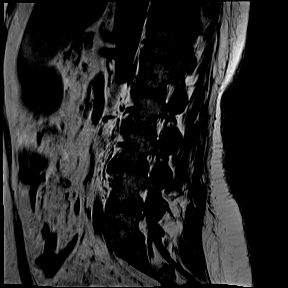
[im 13/13]
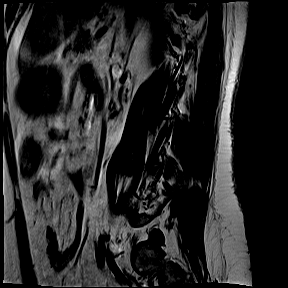

[Series 8: T2 · coronal · 5.0mm · 0.82mm/px · 8 of 18 slices shown (2 of 3)]
[im 1/18]
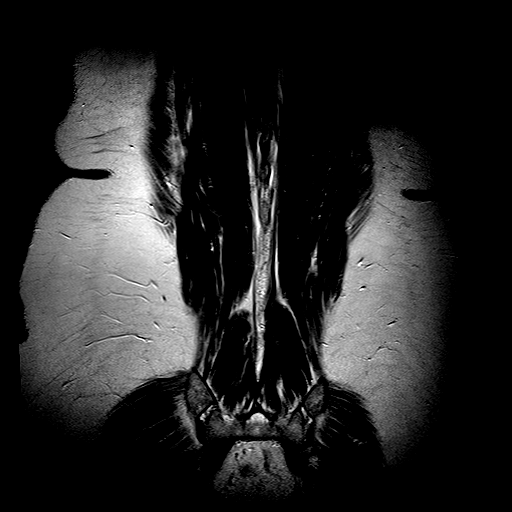
[im 3/18]
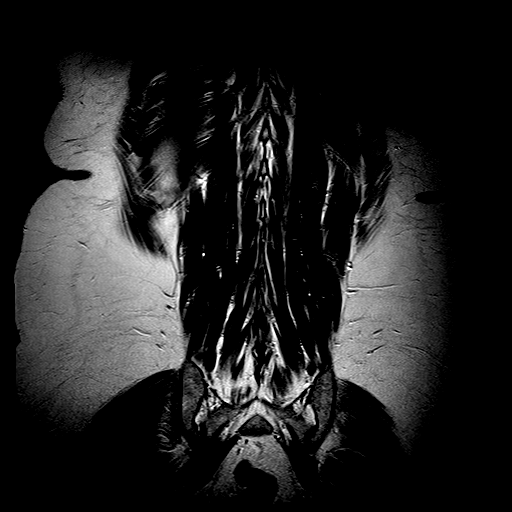
[im 5/18]
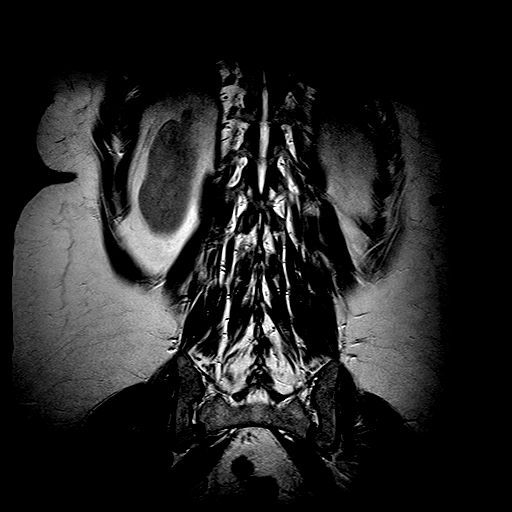
[im 8/18]
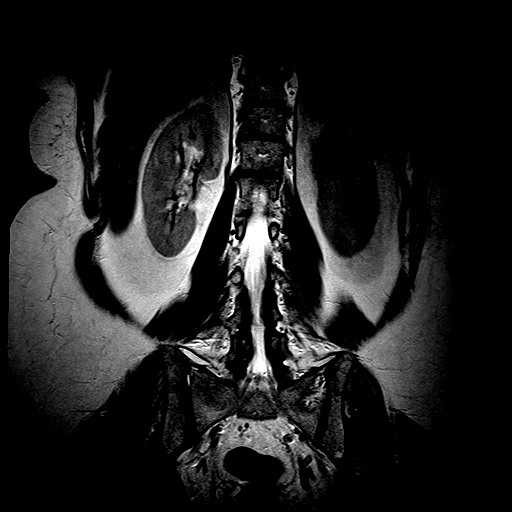
[im 10/18]
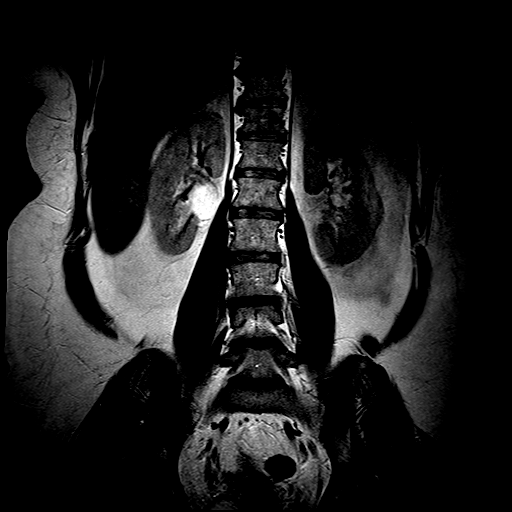
[im 13/18]
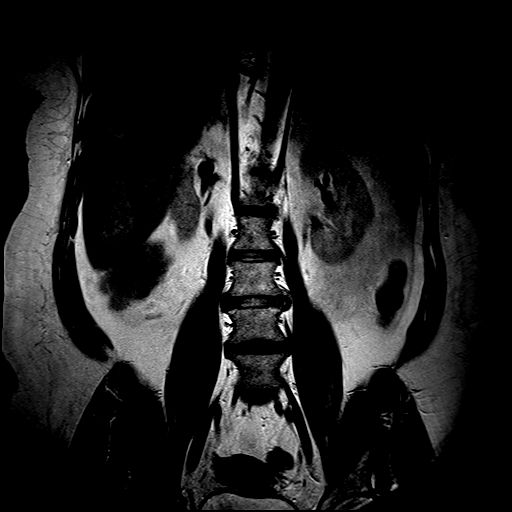
[im 15/18]
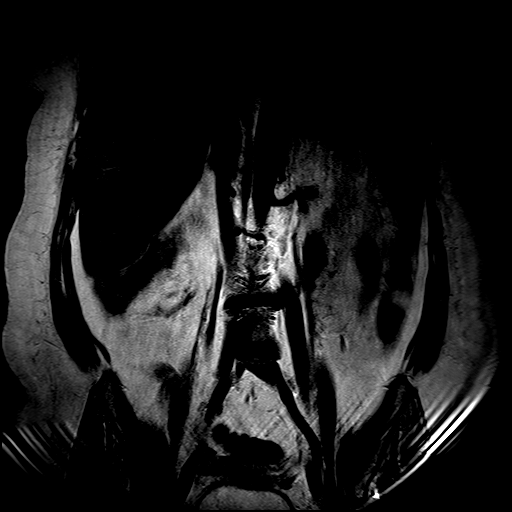
[im 18/18]
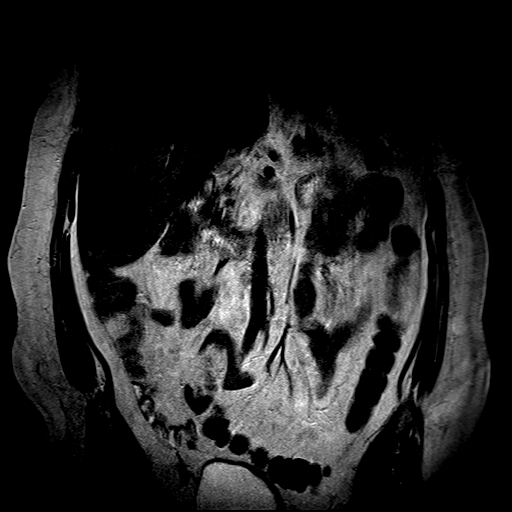

[Series 9: T2 · axial · 4.0mm · 0.52mm/px · z∈[-137,+65]mm · 11 of 23 slices shown (3 of 3)]
[im 1/23]
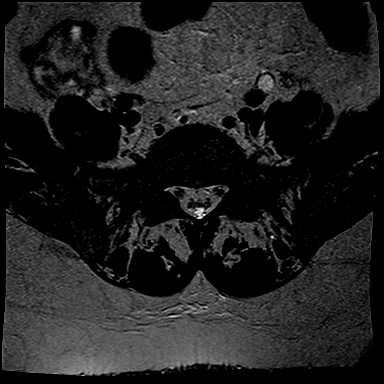
[im 3/23]
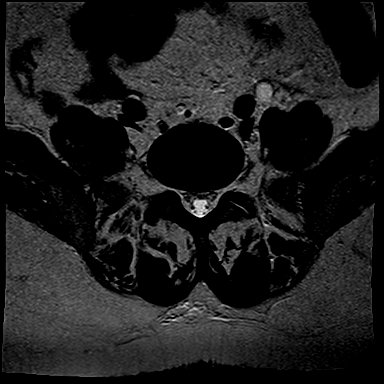
[im 5/23]
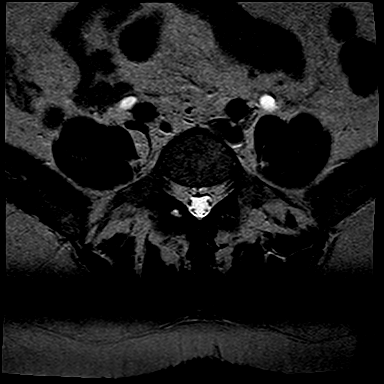
[im 7/23]
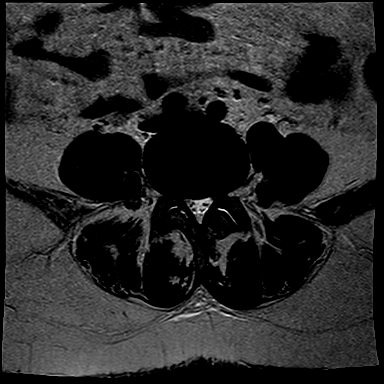
[im 9/23]
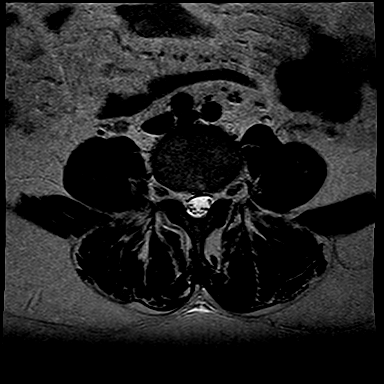
[im 12/23]
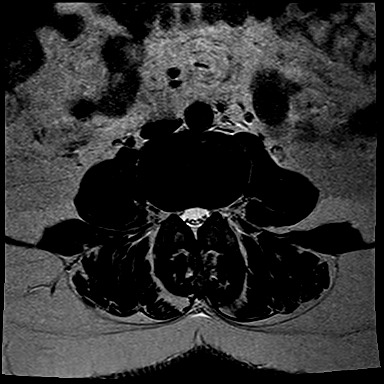
[im 14/23]
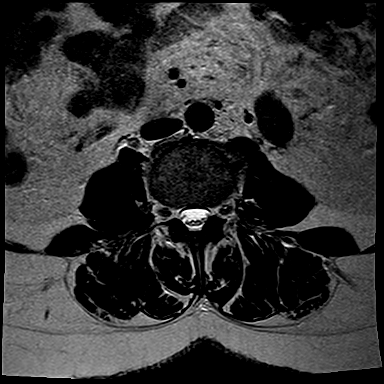
[im 16/23]
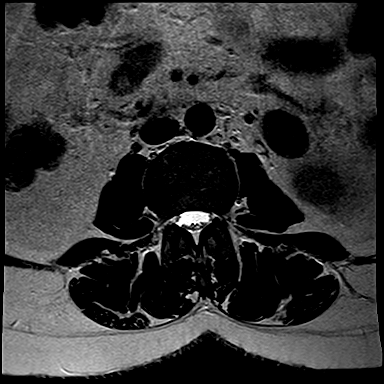
[im 18/23]
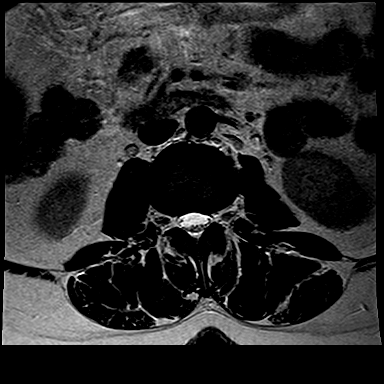
[im 20/23]
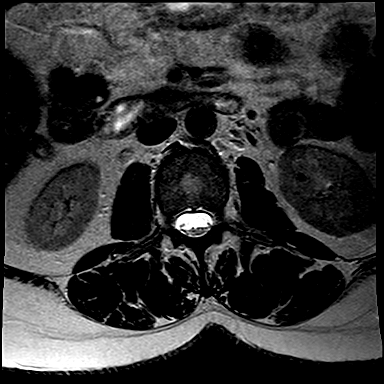
[im 23/23]
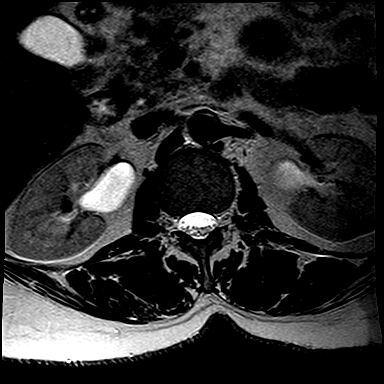

[Series 10: T1 · axial · 4.0mm · 0.52mm/px · 1 of 23 slices shown (2 of 2)]
[im 1/23]
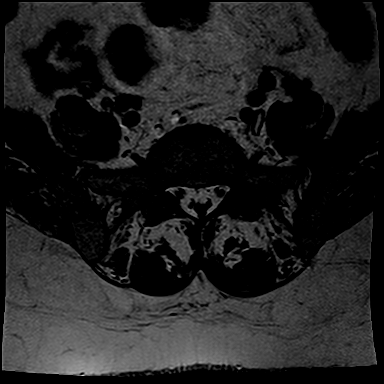

[32 of 48 positions shown; findings below may reference images not displayed]

FINDINGS: Vertebral bodies are normal in height, alignment and signal intensity.  There is no acute fracture or subluxation.  Distal spinal cord is normal in signal intensity and terminates normally at T12-L1 disc space level.  Spinal canal is congenitally narrow.  

L1-L2 and L2-L3 levels are unremarkable.  

At L3-L4 level, there is moderate left neural foraminal stenosis from facet arthropathy and bulging annulus.  

At L4-L5 level, there is a small broad-based central disc bulge, mildly effacing the ventral thecal sac.  There is mild left and moderate right neural foraminal stenosis from facet arthropathy and bulging annulus.  

At L5-S1 level, there is a small broad-based central disc bulge minimally abutting the ventral thecal sac.  There is mild left neural foraminal stenosis from facet arthropathy. 

Paraspinal soft tissues are unremarkable.
IMPRESSION: 1. No significant disc herniation or spinal stenosis at any level.

2. Multilevel neural foraminal stenosis as detailed above.

## 2021-05-25 IMAGING — MR MRI KNEE RT W/O CONTRAST
4 of 5 series · 20 of 40 positions shown · IV contrast (gadolinium)
Comparison: None available.

﻿EXAM:  03038   MRI KNEE RT W/O CONTRAST
INDICATION: Pain.
TECHNIQUE: Multiplanar multisequential MRI of the right knee joint was performed without gadolinium contrast.

[Series 5: PD fat-sat · axial · right · 4.0mm · 0.37mm/px · z∈[-77,+54]mm · 8 of 30 slices shown (1 of 3)]
[im 1/30]
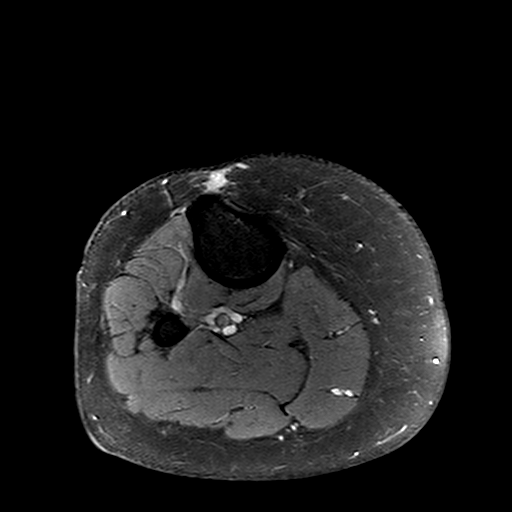
[im 5/30]
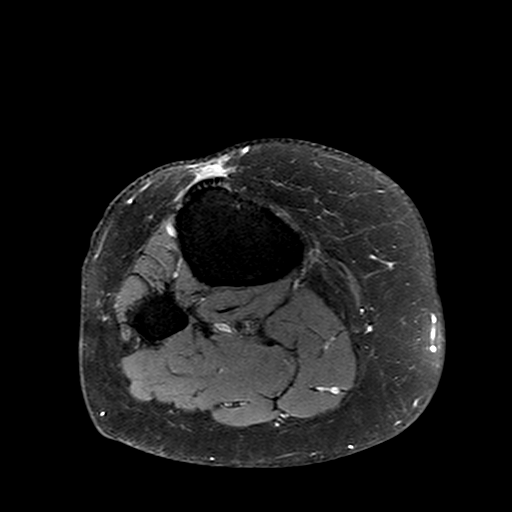
[im 9/30]
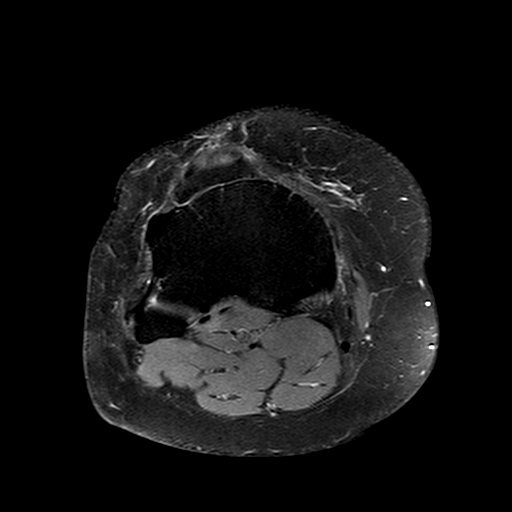
[im 13/30]
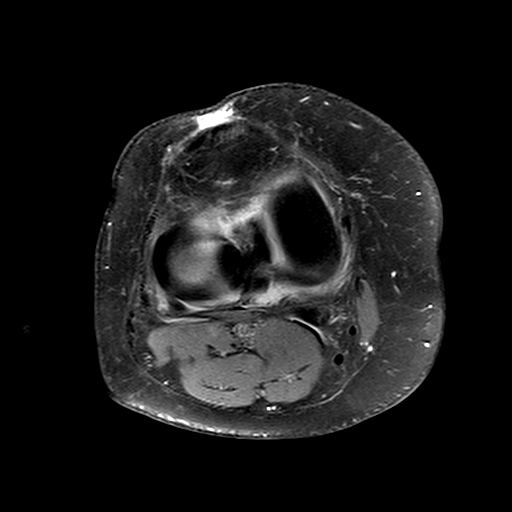
[im 17/30]
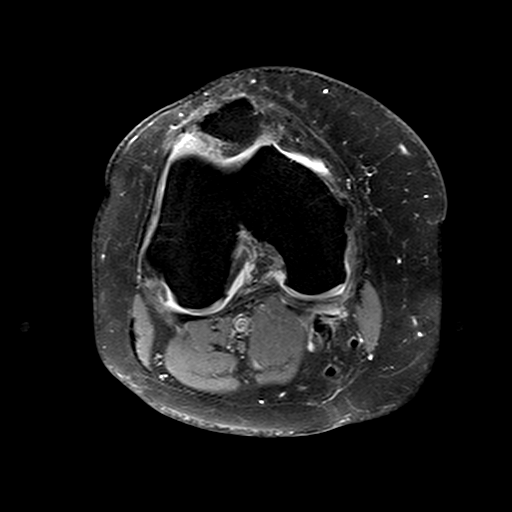
[im 21/30]
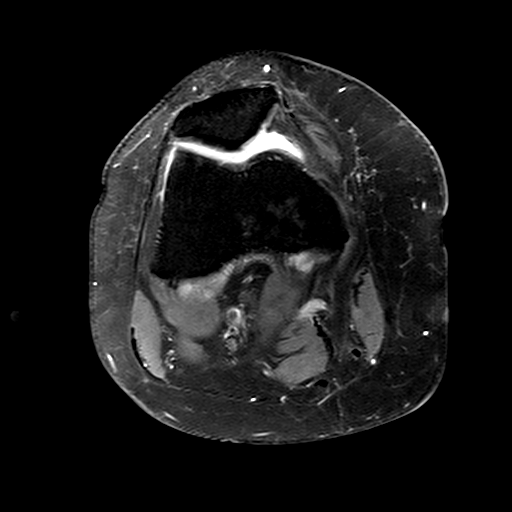
[im 25/30]
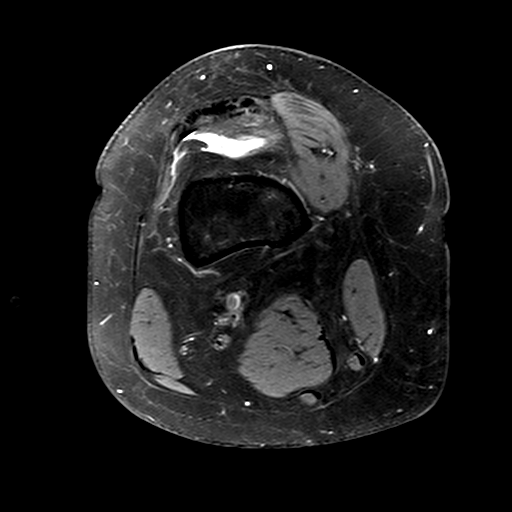
[im 30/30]
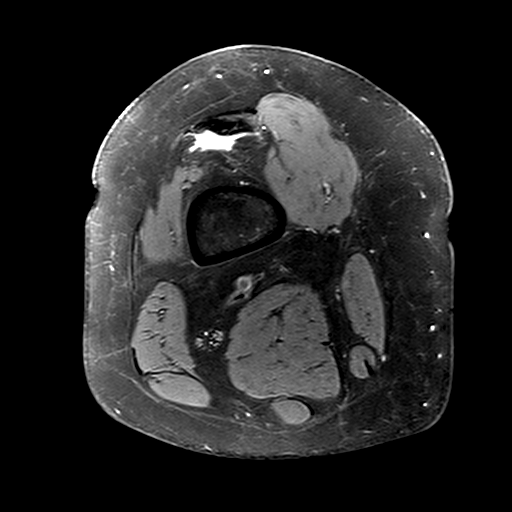

[Series 6: PD fat-sat · sagittal · right · 3.0mm · 0.29mm/px · 6 of 30 slices shown (2 of 3)]
[im 1/30]
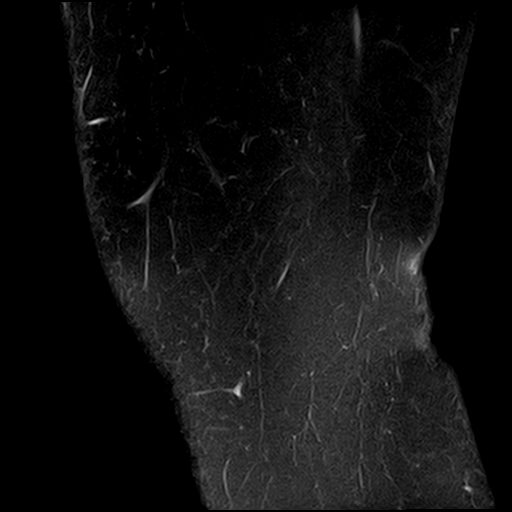
[im 5/30]
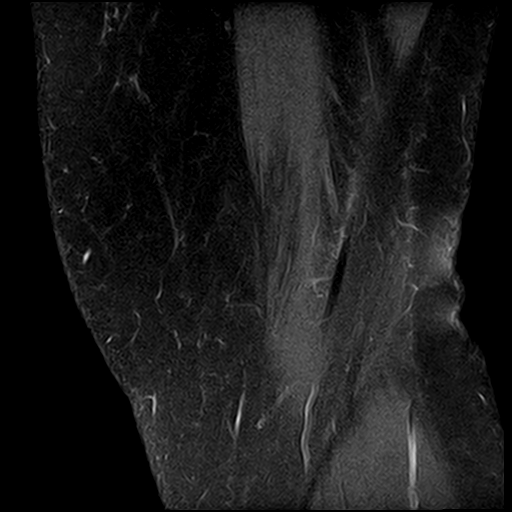
[im 9/30]
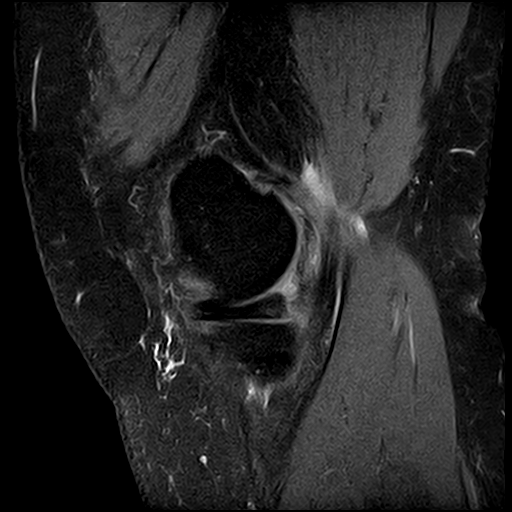
[im 13/30]
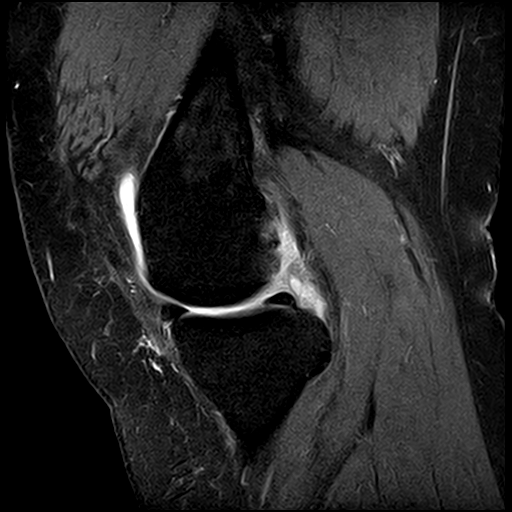
[im 17/30]
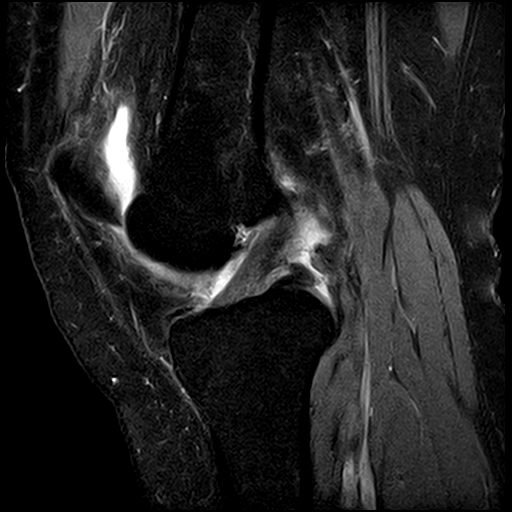
[im 25/30]
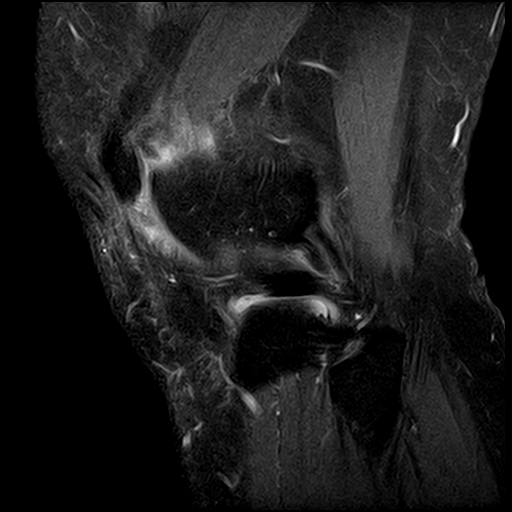

[Series 7: T1 · sagittal · right · 3.0mm · 0.29mm/px · 3 of 30 slices shown]
[im 5/30]
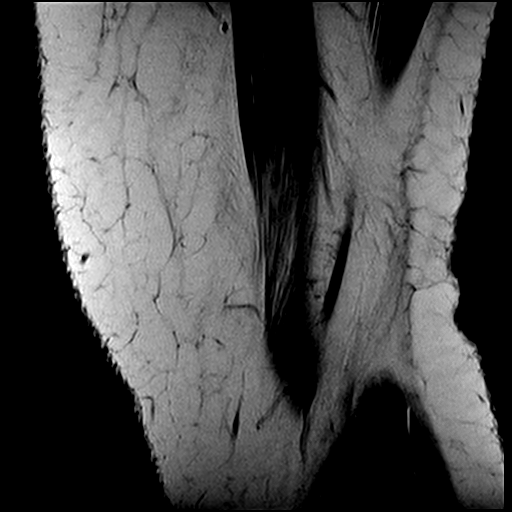
[im 17/30]
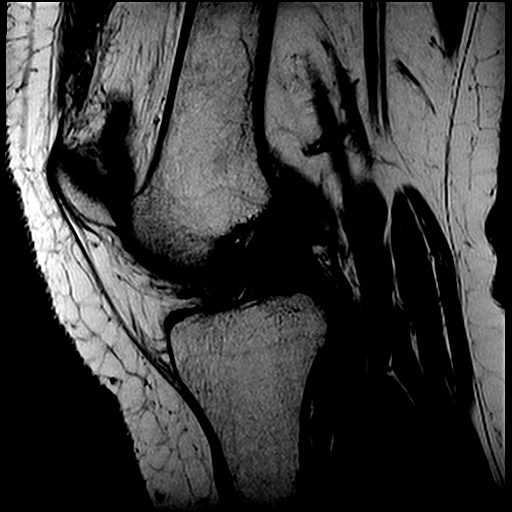
[im 25/30]
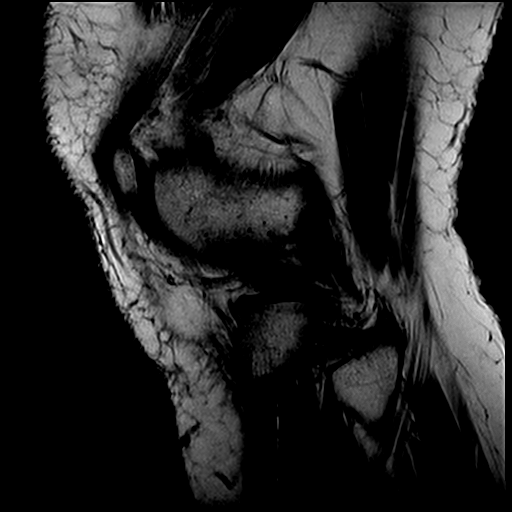

[Series 9: PD fat-sat · coronal · right · 3.0mm · 0.33mm/px · 3 of 27 slices shown (3 of 3)]
[im 4/27]
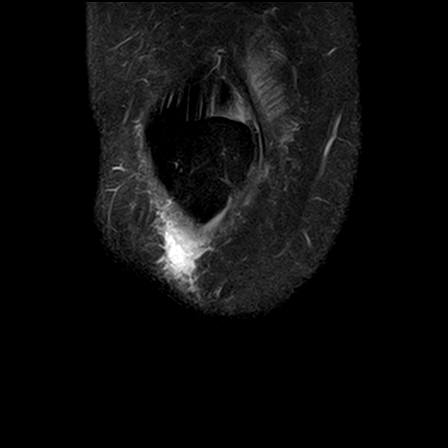
[im 15/27]
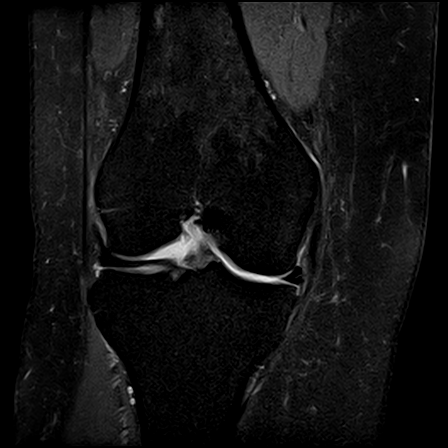
[im 23/27]
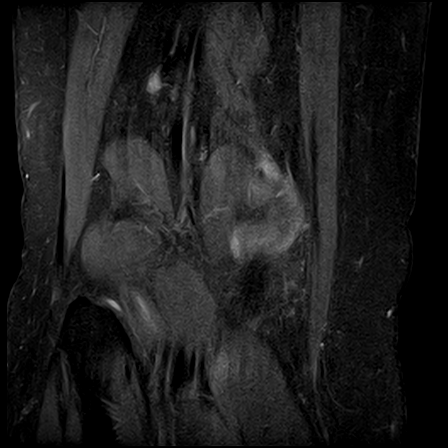

[20 of 40 positions shown; findings below may reference images not displayed]

FINDINGS: Menisci, cruciate and collateral ligaments are intact, within normal limits in morphology and signal intensity. There is grade 3 chondromalacia of the medial tibiofemoral and patellofemoral articulations. Extensor mechanism is intact. Capsular attachments appear unremarkable. Bone marrow signal intensity is normal. There is a moderate joint effusion.  There is no Baker's cyst.
IMPRESSION: 1. Intact menisci, cruciate and collateral ligaments.  

2. Grade 3 chondromalacia of the medial tibiofemoral and patellofemoral articulations.

## 2021-06-03 ENCOUNTER — Other Ambulatory Visit (INDEPENDENT_AMBULATORY_CARE_PROVIDER_SITE_OTHER): Payer: Self-pay

## 2021-06-03 MED ORDER — NADOLOL 20 MG TABLET
20.0000 mg | ORAL_TABLET | Freq: Every day | ORAL | 1 refills | Status: DC
Start: 2021-06-03 — End: 2021-10-28

## 2021-06-03 NOTE — Telephone Encounter (Signed)
This script is active. Follow up appointment is scheduled for 09/2021. Kandis Mannan, RN  06/03/2021, 10:28

## 2021-06-15 ENCOUNTER — Other Ambulatory Visit: Payer: Self-pay

## 2021-10-10 ENCOUNTER — Other Ambulatory Visit (INDEPENDENT_AMBULATORY_CARE_PROVIDER_SITE_OTHER): Payer: Self-pay | Admitting: Family Medicine

## 2021-10-10 ENCOUNTER — Encounter (INDEPENDENT_AMBULATORY_CARE_PROVIDER_SITE_OTHER): Payer: Self-pay

## 2021-10-10 MED ORDER — VALSARTAN 80 MG TABLET
80.0000 mg | ORAL_TABLET | Freq: Every day | ORAL | 4 refills | Status: DC
Start: 2021-10-10 — End: 2021-12-02

## 2021-10-28 ENCOUNTER — Other Ambulatory Visit (INDEPENDENT_AMBULATORY_CARE_PROVIDER_SITE_OTHER): Payer: Self-pay | Admitting: Physician Assistant

## 2021-10-28 MED ORDER — NADOLOL 20 MG TABLET
20.0000 mg | ORAL_TABLET | Freq: Every day | ORAL | 3 refills | Status: DC
Start: 2021-10-28 — End: 2021-12-20

## 2021-12-02 ENCOUNTER — Other Ambulatory Visit (INDEPENDENT_AMBULATORY_CARE_PROVIDER_SITE_OTHER): Payer: Self-pay | Admitting: Physician Assistant

## 2021-12-02 MED ORDER — VALSARTAN 80 MG TABLET
80.0000 mg | ORAL_TABLET | Freq: Every day | ORAL | 4 refills | Status: DC
Start: 2021-12-02 — End: 2021-12-20

## 2021-12-20 ENCOUNTER — Encounter (INDEPENDENT_AMBULATORY_CARE_PROVIDER_SITE_OTHER): Payer: Self-pay

## 2021-12-20 ENCOUNTER — Ambulatory Visit (INDEPENDENT_AMBULATORY_CARE_PROVIDER_SITE_OTHER): Payer: 59 | Admitting: Physician Assistant

## 2021-12-20 ENCOUNTER — Other Ambulatory Visit: Payer: Self-pay

## 2021-12-20 VITALS — BP 122/72 | HR 68 | Ht 65.0 in | Wt 221.0 lb

## 2021-12-20 DIAGNOSIS — I2510 Atherosclerotic heart disease of native coronary artery without angina pectoris: Secondary | ICD-10-CM

## 2021-12-20 DIAGNOSIS — Z789 Other specified health status: Secondary | ICD-10-CM | POA: Insufficient documentation

## 2021-12-20 DIAGNOSIS — E782 Mixed hyperlipidemia: Secondary | ICD-10-CM | POA: Insufficient documentation

## 2021-12-20 DIAGNOSIS — R011 Cardiac murmur, unspecified: Secondary | ICD-10-CM | POA: Insufficient documentation

## 2021-12-20 DIAGNOSIS — I10 Essential (primary) hypertension: Secondary | ICD-10-CM

## 2021-12-20 HISTORY — DX: Atherosclerotic heart disease of native coronary artery without angina pectoris: I25.10

## 2021-12-20 MED ORDER — VALSARTAN 80 MG TABLET
80.0000 mg | ORAL_TABLET | Freq: Every day | ORAL | 3 refills | Status: DC
Start: 2021-12-20 — End: 2022-12-25

## 2021-12-20 MED ORDER — NITROGLYCERIN 0.4 MG SUBLINGUAL TABLET
0.4000 mg | SUBLINGUAL_TABLET | SUBLINGUAL | 3 refills | Status: DC | PRN
Start: 2021-12-20 — End: 2023-02-20

## 2021-12-20 MED ORDER — NADOLOL 20 MG TABLET
20.0000 mg | ORAL_TABLET | Freq: Every day | ORAL | 3 refills | Status: DC
Start: 2021-12-20 — End: 2022-11-10

## 2021-12-20 NOTE — Procedures (Signed)
[place] [address] [place]  [place] [address]    Procedure Note    Name: Courtney Maynard MRN:  [id number]   Date: 12/20/2021 Age: 61 y.o.  DOB:   06/24/1960       ECG - In Clinic    Performed by: Freda Munro, PA-C  Authorized by: Freda Munro, PA-C      Sinus rhythm, diffuse nonspecific ST changes.    Freda Munro, PA-C

## 2021-12-20 NOTE — Progress Notes (Signed)
Cardiology [facility], [facility]  [address] [place] [address]  [phone]    Cardiology  Clinic Note    Name: Courtney Maynard   DOB: [birth year]  [61 y.o. female]   MRN: [id number]       Visit Date: 12/20/2021   Referring: No referring provider defined for this encounter.   PCP: Boneta Lucks, MD         Chief Complaint: Annual Exam      History of Present Illness   Courtney Maynard is a 61 y.o.    White  Unknown female who presents for her annual evaluation.  She has a history of catheterization 10/12/2020 revealing nearly normal coronary arteries with 5% luminal irregularity of the LAD only.  Her only cardiac complaint at this time has occasional brief palpitations.  She has had no prolonged or severe episodes.  She denies chest pain, shortness of breath, orthopnea, PND, presyncope, syncope, edema.    EKG today:  Sinus rhythm with diffuse nonspecific ST changes.  No acute change.    We will try to obtain a copy of her most recent lipid panel in routine labs from her primary care provider.    Patient Active Problem List    Diagnosis Date Noted    Cardiac murmur 12/20/2021    Mixed hyperlipidemia 12/20/2021    Essential hypertension 12/20/2021    Coronary artery disease involving native coronary artery of native heart without angina pectoris 12/20/2021    Nonsmoker 12/20/2021       Allergies  Allergies   Allergen Reactions    Crestor [Rosuvastatin]        Medications    Current Outpatient Medications:     ascorbic acid, vitamin C, (VITAMIN C) 500 mg Oral Tablet, Take 1 Tablet (500 mg total) by mouth Once a day, Disp: , Rfl:     aspirin (ECOTRIN) 81 mg Oral Tablet, Delayed Release (E.C.), Take 1 Tablet (81 mg total) by mouth Once a day, Disp: , Rfl:     celecoxib (CELEBREX) 200 mg Oral Capsule, Take 1 Capsule (200 mg total) by mouth Twice daily, Disp: , Rfl:     fenofibrate nanocrystallized (TRICOR) 145 mg Oral Tablet, Take 1 Tablet (145 mg total) by mouth  Every morning with breakfast, Disp: , Rfl:     ferrous fumarate/vit Bcomp,C (SUPER B COMPLEX ORAL), Take 1 Tablet by mouth Once a day, Disp: , Rfl:     krill-om-3-dha-epa-phospho-ast (KRILL OIL) 1,000-170-50-80 mg Oral Capsule, Take 1 Capsule by mouth Once a day, Disp: , Rfl:     montelukast (SINGULAIR) 10 mg Oral Tablet, Take 1 Tablet (10 mg total) by mouth Every evening, Disp: , Rfl:     Nadolol (CORGARD) 20 mg Oral Tablet, Take 1 Tablet (20 mg total) by mouth Once a day, Disp: 90 Tablet, Rfl: 3    Naproxen 500 mg Oral Tablet, Delayed Release (E.C.), Take 1 Tablet (500 mg total) by mouth Twice daily, Disp: , Rfl:     nitroGLYCERIN (NITROSTAT) 0.4 mg Sublingual Tablet, Sublingual, Place 1 Tablet (0.4 mg total) under the tongue Every 5 minutes as needed for Chest pain for 3 doses over 15 minutes, Disp: 25 Tablet, Rfl: 3    oxyBUTYnin (DITROPAN) 5 mg Oral Tablet, Take 1 Tablet (5 mg total) by mouth Three times a day, Disp: , Rfl:     traZODone (DESYREL) 50 mg Oral Tablet, Take 1 Tablet (50 mg total) by mouth Every  night, Disp: , Rfl:     valsartan (DIOVAN) 80 mg Oral Tablet, Take 1 Tablet (80 mg total) by mouth Once a day, Disp: 90 Tablet, Rfl: 3    History  Past Medical History:   Diagnosis Date    Anemia, unspecified     Anxiety     Bronchitis     Cardiac murmur     Coronary artery disease involving native coronary artery of native heart without angina pectoris 12/20/2021    Essential hypertension     Mixed hyperlipidemia     Spasmodic dysphonia     Vitamin D deficiency          Past Surgical History:   Procedure Laterality Date    ANAL FISTULOTOMY      ENDOMETRIAL ABLATION W/ NOVASURE      HX APPENDECTOMY      HX TONSILLECTOMY      HX TUBAL LIGATION      KNEE ARTHROSCOPY       Social History     Socioeconomic History    Marital status: Divorced   Tobacco Use    Smoking status: Never    Smokeless tobacco: Never   Substance and Sexual Activity    Alcohol use: No    Drug use: Never     Family Medical History:        Problem Relation (Age of Onset)    Cancer Father, Sister, Brother    Coronary Artery Disease Sister    Diabetes Father, Sister    Heart Attack Mother, Brother    Heart Disease Mother, Father, Brother    Hypertension (High Blood Pressure) Mother, Father, Sister, Brother              Review of Systems:  Constitutional: No significant weight gain or loss, fatigue or fever.  Respiratory: No shortness of breath, emphysema, COPD, black lung, or sleep apnea  Cardiovascular:  Palpitations.  No chest pain, orthopnea, PND, claudication or edema.  Gastrointestinal: No nausea, vomiting, diarrhea.  Endocrine: No thyroid problems or diabetes  Musculoskeletal: Arthritis, history of  gout  Neurologic: No history of strokes or seizures, dizziness, presyncope or syncope.  Psychiatric: No anxiety or depression      Physical Examination:  BP 122/72   Pulse 68   Ht 1.651 m (5\' 5" )   Wt 100 kg (221 lb)   SpO2 96%   BMI 36.78 kg/m       General: Alert, no acute distress  Neck: No carotid bruits, No JVD  Lungs: Clear to auscultation bilaterally, non-labored respiration.  Heart: Normal rate, regular rhythm, no murmur.  Abdomen: Soft, non-tender.  Neurologic: No motor or sensory deficits.  Extremities:  No edema, normal peripheral pulse.        Orders Placed This Encounter    ECG - In Clinic    Nadolol (CORGARD) 20 mg Oral Tablet    valsartan (DIOVAN) 80 mg Oral Tablet    nitroGLYCERIN (NITROSTAT) 0.4 mg Sublingual Tablet, Sublingual       Medications Discontinued During This Encounter   Medication Reason    cholecalciferol, vitamin D3, 25 mcg (1,000 unit) Oral Tablet Patient states no longer taking    MULTIVIT WITH CALCIUM,IRON,MIN [facility] MULTIPLE VITAMINS ORAL) Patient states no longer taking    Nadolol (CORGARD) 20 mg Oral Tablet Reorder    valsartan (DIOVAN) 80 mg Oral Tablet Reorder       Assessment and Plan:  Assessment/Plan   1. Coronary artery disease involving native coronary artery of  native heart without angina pectoris     2. Essential hypertension    3. Mixed hyperlipidemia    4. Nonsmoker        Courtney Maynard appears to be doing well from a cardiac standpoint.  Her coronary artery disease is stable.  She has no current anginal or anginal equivalent symptoms.  Blood pressure is adequately controlled.  We will continue medical therapy and risk factor reduction.  She was encouraged to engage in regular exercise.  She was asked to contact us or go to the emergency room if there are any changes in her cardiac status.  She will follow-up in 1 year or sooner if necessary.    Thank you for allowing [place] to participate in care of your patient.  If we can be of further assistance in her care at any time please let us know.      Sincerely,     Billie Lade, PA-C      Follow up:  Return in about 1 year (around 12/21/2022).        Jori Moll, PA-C  [facility]  Cardiology  [facility]    A portion of this documentation may have been generated using [employer] voice recognition software and may contain syntax/voice recognition errors.

## 2022-03-19 ENCOUNTER — Encounter (INDEPENDENT_AMBULATORY_CARE_PROVIDER_SITE_OTHER): Payer: Self-pay

## 2022-11-10 ENCOUNTER — Other Ambulatory Visit (INDEPENDENT_AMBULATORY_CARE_PROVIDER_SITE_OTHER): Payer: Self-pay | Admitting: Physician Assistant

## 2022-11-10 MED ORDER — NADOLOL 20 MG TABLET
20.0000 mg | ORAL_TABLET | Freq: Every day | ORAL | 1 refills | Status: DC
Start: 2022-11-10 — End: 2023-02-20

## 2022-12-21 ENCOUNTER — Encounter (INDEPENDENT_AMBULATORY_CARE_PROVIDER_SITE_OTHER): Payer: Self-pay

## 2022-12-25 ENCOUNTER — Other Ambulatory Visit (INDEPENDENT_AMBULATORY_CARE_PROVIDER_SITE_OTHER): Payer: Self-pay | Admitting: Physician Assistant

## 2022-12-25 MED ORDER — VALSARTAN 80 MG TABLET
80.0000 mg | ORAL_TABLET | Freq: Every day | ORAL | 3 refills | Status: AC
Start: 2022-12-25 — End: ?

## 2023-02-20 ENCOUNTER — Other Ambulatory Visit: Payer: Self-pay

## 2023-02-20 ENCOUNTER — Encounter (INDEPENDENT_AMBULATORY_CARE_PROVIDER_SITE_OTHER): Payer: Self-pay

## 2023-02-20 ENCOUNTER — Ambulatory Visit (INDEPENDENT_AMBULATORY_CARE_PROVIDER_SITE_OTHER): Payer: Medicare (Managed Care) | Admitting: Physician Assistant

## 2023-02-20 VITALS — BP 136/80 | HR 66 | Ht 65.0 in | Wt 233.0 lb

## 2023-02-20 DIAGNOSIS — Z789 Other specified health status: Secondary | ICD-10-CM

## 2023-02-20 DIAGNOSIS — R002 Palpitations: Secondary | ICD-10-CM | POA: Insufficient documentation

## 2023-02-20 DIAGNOSIS — R9431 Abnormal electrocardiogram [ECG] [EKG]: Secondary | ICD-10-CM

## 2023-02-20 DIAGNOSIS — E782 Mixed hyperlipidemia: Secondary | ICD-10-CM

## 2023-02-20 DIAGNOSIS — I10 Essential (primary) hypertension: Secondary | ICD-10-CM

## 2023-02-20 DIAGNOSIS — I2510 Atherosclerotic heart disease of native coronary artery without angina pectoris: Secondary | ICD-10-CM

## 2023-02-20 MED ORDER — NADOLOL 20 MG TABLET
20.0000 mg | ORAL_TABLET | Freq: Every day | ORAL | 3 refills | Status: AC
Start: 2023-02-20 — End: ?

## 2023-02-20 MED ORDER — NITROGLYCERIN 0.4 MG SUBLINGUAL TABLET
0.4000 mg | SUBLINGUAL_TABLET | SUBLINGUAL | 3 refills | Status: AC | PRN
Start: 2023-02-20 — End: ?

## 2023-02-20 NOTE — Progress Notes (Signed)
Cardiology [facility], [facility]  [address] [place] [address]  [phone]    Cardiology  Clinic Note    Name: Courtney Maynard [facility]   DOB: Feb 16, 1960  [63 y.o. female]   MRN: [id number]       Visit Date: 02/20/2023   Referring: No referring provider defined for this encounter.   PCP: Boneta Lucks, MD         Chief Complaint: Annual Exam      History of Present Illness   Courtney Maynard is a 63 y.o.    White  Unknown female who presents for her annual evaluation.  She has a history of catheterization 10/12/2020 finding nearly normal coronaries with 5% luminal irregularity of the LAD only.  She has a long history of palpitations which has been slightly worse lately which she attributes to stress.  They can last several seconds and coughing seems to help.  She has had no prolonged or severe episodes.  She has occasional dependent edema.  She denies chest pain, shortness of breath, orthopnea, PND, presyncope, syncope.  She was hypertensive on presentation with initial blood pressure 150/82, 136/80 on recheck.    EKG today: Sinus rhythm, diffuse nonspecific ST-T changes.    We will try to obtain a copy of her most recent lipid panel and routine labs from her primary care provider.    Patient Active Problem List    Diagnosis Date Noted    Palpitations 02/20/2023    Cardiac murmur 12/20/2021    Mixed hyperlipidemia 12/20/2021    Essential hypertension 12/20/2021    Coronary artery disease involving native coronary artery of native heart without angina pectoris 12/20/2021    Nonsmoker 12/20/2021       Allergies  Allergies   Allergen Reactions    Crestor [Rosuvastatin]        Medications    Current Outpatient Medications:     ascorbic acid, vitamin C, (VITAMIN C) 500 mg Oral Tablet, Take 1 Tablet (500 mg total) by mouth Once a day, Disp: , Rfl:     aspirin (ECOTRIN) 81 mg Oral Tablet, Delayed Release (E.C.), Take 1 Tablet (81 mg total) by mouth Once a day, Disp: , Rfl:      fenofibrate nanocrystallized (TRICOR) 145 mg Oral Tablet, Take 1 Tablet (145 mg total) by mouth Every morning with breakfast, Disp: , Rfl:     ferrous fumarate/vit Bcomp,C (SUPER B COMPLEX ORAL), Take 1 Tablet by mouth Once a day, Disp: , Rfl:     krill-om-3-dha-epa-phospho-ast (KRILL OIL) 1,000-170-50-80 mg Oral Capsule, Take 1 Capsule by mouth Once a day, Disp: , Rfl:     montelukast (SINGULAIR) 10 mg Oral Tablet, Take 1 Tablet (10 mg total) by mouth Every evening, Disp: , Rfl:     Nadolol (CORGARD) 20 mg Oral Tablet, Take 1 Tablet (20 mg total) by mouth Once a day, Disp: 90 Tablet, Rfl: 3    nitroGLYCERIN (NITROSTAT) 0.4 mg Sublingual Tablet, Sublingual, Place 1 Tablet (0.4 mg total) under the tongue Every 5 minutes as needed for Chest pain for 3 doses over 15 minutes, Disp: 25 Tablet, Rfl: 3    oxyBUTYnin (DITROPAN) 5 mg Oral Tablet, Take 1 Tablet (5 mg total) by mouth Three times a day, Disp: , Rfl:     traZODone (DESYREL) 50 mg Oral Tablet, Take 1 Tablet (50 mg total) by mouth Every night, Disp: , Rfl:     valsartan (DIOVAN) 80 mg Oral  Tablet, Take 1 Tablet (80 mg total) by mouth Once a day, Disp: 90 Tablet, Rfl: 3    History  Past Medical History:   Diagnosis Date    Anemia, unspecified     Anxiety     Bronchitis     Cardiac murmur     Coronary artery disease involving native coronary artery of native heart without angina pectoris 12/20/2021    Essential hypertension     Mixed hyperlipidemia     Spasmodic dysphonia     Vitamin D deficiency          Past Surgical History:   Procedure Laterality Date    ANAL FISTULOTOMY      ENDOMETRIAL ABLATION W/ NOVASURE      HX APPENDECTOMY      HX TONSILLECTOMY      HX TUBAL LIGATION      KNEE ARTHROSCOPY       Social History     Socioeconomic History    Marital status: Divorced   Tobacco Use    Smoking status: Never    Smokeless tobacco: Never   Substance and Sexual Activity    Alcohol use: No    Drug use: Never     Family Medical History:       Problem Relation (Age of  Onset)    Cancer Father, Sister, Brother    Coronary Artery Disease Sister    Diabetes Father, Sister    Heart Attack Mother, Brother    Heart Disease Mother, Father, Brother    Hypertension (High Blood Pressure) Mother, Father, Sister, Brother              Review of Systems:  Review of Systems   Constitutional: Negative for chills and fever.   Cardiovascular:  Positive for leg swelling and palpitations. Negative for chest pain, near-syncope, orthopnea, paroxysmal nocturnal dyspnea and syncope.   Respiratory:  Negative for cough and shortness of breath.    Endocrine: Negative for cold intolerance and heat intolerance.   Hematologic/Lymphatic: Negative for bleeding problem.   Musculoskeletal:  Positive for arthritis. Negative for gout.   Gastrointestinal:  Negative for abdominal pain, nausea and vomiting.   Neurological:  Negative for dizziness and weakness.   Psychiatric/Behavioral:  Negative for depression. The patient is not nervous/anxious.           Physical Examination:  BP 136/80   Pulse 66   Ht 1.651 m (5\' 5" )   Wt 106 kg (233 lb)   SpO2 97%   BMI 38.77 kg/m         Physical Exam  Constitutional:       General: She is not in acute distress.  Neck:      Vascular: No carotid bruit.      Comments: No JVD  Cardiovascular:      Rate and Rhythm: Normal rate and regular rhythm.      Heart sounds: No murmur heard.  Pulmonary:      Effort: No respiratory distress.      Breath sounds: No wheezing, rhonchi or rales.   Abdominal:      General: There is no distension.      Palpations: Abdomen is soft.   Musculoskeletal:         General: No swelling.   Neurological:      General: No focal deficit present.      Mental Status: She is alert.   Psychiatric:         Behavior: Behavior normal.  Orders Placed This Encounter    ECG - In Clinic (Non-Muse)    Nadolol (CORGARD) 20 mg Oral Tablet    nitroGLYCERIN (NITROSTAT) 0.4 mg Sublingual Tablet, Sublingual       Medications Discontinued During This Encounter    Medication Reason    celecoxib (CELEBREX) 200 mg Oral Capsule Patient states no longer taking    Naproxen 500 mg Oral Tablet, Delayed Release (E.C.) Patient states no longer taking    Nadolol (CORGARD) 20 mg Oral Tablet Reorder    nitroGLYCERIN (NITROSTAT) 0.4 mg Sublingual Tablet, Sublingual Reorder       Assessment and Plan:  Assessment/Plan   1. Coronary artery disease involving native coronary artery of native heart without angina pectoris    2. Palpitations    3. Essential hypertension    4. Mixed hyperlipidemia    5. Nonsmoker        Ms. Pecha appears to be doing well from a cardiac standpoint.  She has no anginal or anginal equivalent symptoms.  We will continue medical therapy and risk factor reduction.  I have asked that she monitor blood pressure at home and contact us or her primary care provider if not adequately controlled.  She was encouraged to engage in regular exercise.  She was asked to contact us if there are any changes in her cardiac status.  She will follow up in 1 year or sooner if needed.    Thank you for allowing [place] to participate in care of your patient.  If we can be of further assistance in his care at any time please let us know.      Sincerely,     Billie Lade, PA-C      Follow up:  Return in about 1 year (around 02/20/2024).        Jori Moll, PA-C  [facility]  Cardiology  [facility]    A portion of this documentation may have been generated using [facility] voice recognition software and may contain syntax/voice recognition errors.

## 2023-02-20 NOTE — Procedures (Signed)
CARDIOLOGY [facility] [place], [facility]  [address]  [place] [address]    Procedure Note    Name: Courtney Maynard MRN:  [id number]   Date: 02/20/2023 DOB:  [birth year] (62 y.o.)         ECG - In Clinic (Non-Muse)    Performed by: Jori Moll, PA-C  Authorized by: Jori Moll, PA-C      Sinus rhythm, diffuse nonspecific ST-T changes.    Jori Moll, PA-C

## 2023-06-19 ENCOUNTER — Encounter (INDEPENDENT_AMBULATORY_CARE_PROVIDER_SITE_OTHER): Payer: Self-pay

## 2024-01-14 ENCOUNTER — Other Ambulatory Visit: Payer: Self-pay

## 2024-01-15 ENCOUNTER — Telehealth (INDEPENDENT_AMBULATORY_CARE_PROVIDER_SITE_OTHER): Payer: Self-pay | Admitting: OTOLARYNGOLOGY

## 2024-01-15 ENCOUNTER — Encounter (INDEPENDENT_AMBULATORY_CARE_PROVIDER_SITE_OTHER): Payer: Self-pay | Admitting: OTOLARYNGOLOGY

## 2024-01-15 ENCOUNTER — Ambulatory Visit (INDEPENDENT_AMBULATORY_CARE_PROVIDER_SITE_OTHER): Payer: Medicare (Managed Care) | Admitting: OTOLARYNGOLOGY

## 2024-01-15 VITALS — Temp 96.8°F | Ht 65.0 in | Wt 198.2 lb

## 2024-01-15 DIAGNOSIS — J383 Other diseases of vocal cords: Secondary | ICD-10-CM

## 2024-01-15 MED ORDER — LIDOCAINE 4% AND PHENYLEPHRINE 1% NASAL SOLUTION: 5.0000 [drp] | Freq: Once | NASAL | Status: AC

## 2024-01-15 NOTE — Telephone Encounter (Signed)
 From: Joshua Console, MD   Sent: 01/15/2024   3:28 PM EST   To: Augustin Quant, RN   Subject: Add onto 02/07/24 Botox  clinic and get Auth        Please add onto 02/07/24 Botox  clinic and get Auth.     Console     RN spoke with patient, agreeable to scheduled appointment. Staff message sent to [facility] for auth. No further questions noted

## 2024-01-15 NOTE — H&P (Signed)
 ENT, [facility] ENT  [address] [place]  [place] [address]  [phone]    PATIENT NAME:  Courtney Maynard  MRN:  [id number]  DOB:  Dec 20, 1960  DATE OF SERVICE: 01/15/2024    Chief Complaint:  Spastic Dysphonia      HPI:  Courtney Maynard is a 63 y.o. female presenting as a new patient for evaluation of long-standing history spasmodic dysphonia. Patient typically follows in [place] for bilateral botox  injections to vocal cords every 4 months. Her last Botox  injection was in May/June 2025. She has been doing botox  injections for 20+ years.     Patient reports her local ENT provider is unable to perform her botox  until at least April 2026. She wishes to have injection sooner as her most recent botox  has faded and it significantly improves her quality of life. No other concerns today.     Review of Information:    Referral from [facility], Dr. Ramonita reviewed under media tab    Past Medical History:  Past Medical History:   Diagnosis Date    Anemia, unspecified     Anxiety     Bronchitis     Cardiac murmur     Coronary artery disease involving native coronary artery of native heart without angina pectoris 12/20/2021    Essential hypertension     Mixed hyperlipidemia     Spasmodic dysphonia     Vitamin D deficiency          Past Surgical History:  Past Surgical History:   Procedure Laterality Date    ANAL FISTULOTOMY      ENDOMETRIAL ABLATION W/ NOVASURE      HX APPENDECTOMY      HX TONSILLECTOMY      HX TUBAL LIGATION      KNEE ARTHROSCOPY           Family History:  Family Medical History:       Problem Relation (Age of Onset)    Cancer Father, Sister, Brother    Coronary Artery Disease Sister    Diabetes Father, Sister    Heart Attack Mother, Brother    Heart Disease Mother, Father, Brother    Hypertension (High Blood Pressure) Mother, Father, Sister, Brother            Social History:  Tobacco Use History[1]  Social History     Substance and Sexual Activity   Alcohol Use No     Social History      Occupational History    Not on file       Medications:  Outpatient Medications Marked as Taking for the 01/15/24 encounter (Office Visit) with Joshua Console, MD   Medication Sig    ascorbic acid, vitamin C, (VITAMIN C) 500 mg Oral Tablet Take 1 Tablet (500 mg total) by mouth Once a day    aspirin (ECOTRIN) 81 mg Oral Tablet, Delayed Release (E.C.) Take 1 Tablet (81 mg total) by mouth Once a day    fenofibrate nanocrystallized (TRICOR) 145 mg Oral Tablet Take 1 Tablet (145 mg total) by mouth Every morning with breakfast    ferrous fumarate/vit Bcomp,C (SUPER B COMPLEX ORAL) Take 1 Tablet by mouth Once a day    krill-om-3-dha-epa-phospho-ast (KRILL OIL) 1,000-170-50-80 mg Oral Capsule Take 1 Capsule by mouth Once a day    montelukast (SINGULAIR) 10 mg Oral Tablet Take 1 Tablet (10 mg total) by mouth Every evening    Nadolol  (CORGARD ) 20 mg Oral Tablet Take 1 Tablet (20  mg total) by mouth Once a day    nitroGLYCERIN  (NITROSTAT ) 0.4 mg Sublingual Tablet, Sublingual Place 1 Tablet (0.4 mg total) under the tongue Every 5 minutes as needed for Chest pain for 3 doses over 15 minutes    oxyBUTYnin (DITROPAN) 5 mg Oral Tablet Take 1 Tablet (5 mg total) by mouth Three times a day    traZODone (DESYREL) 50 mg Oral Tablet Take 1 Tablet (50 mg total) by mouth Every night    valsartan  (DIOVAN ) 80 mg Oral Tablet Take 1 Tablet (80 mg total) by mouth Once a day       Allergies:  Allergies[2]    Review of Systems:  Do you have any fevers: (Patient-Rptd) (P) no   Any weight change: (Patient-Rptd) (P) no   Change in your vision: (Patient-Rptd) (P) no    Chest Pain: (Patient-Rptd) (P) no   Shortness of Breath: (Patient-Rptd) (P) no   Stomach pain: (Patient-Rptd) (P) no   Urinary difficulity: (Patient-Rptd) (P) no   Joint Pain: (Patient-Rptd) (P) yes   Skin Problems: (Patient-Rptd) (P) no   Weakness or Numbness: (Patient-Rptd) (P) no   Easy Bruising or Bleeding: (Patient-Rptd) (P) no   Excessive Thirst: (Patient-Rptd) (P) no    Seasonal Allergies: (Patient-Rptd) (P) yes    Pertinent ROS per HPI.   Denies fever/chills, SOB, and chest pain.   All other systems reviewed and found to be negative.    Physical Exam:  Temperature 36 C (96.8 F), height 1.651 m (5' 5), weight 89.9 kg (198 lb 3.1 oz).  Body mass index is 32.98 kg/m.  General Appearance: Pleasant, cooperative, healthy, and in no acute distress.  Eyes: Conjunctivae/corneas clear, pupils equal and round, EOM's intact.  Head and Face: Normocephalic, atraumatic.  Face symmetric, no obvious lesions.   Nose: External pyramid midline. Septum midline. Inferior turbinates normal. Mucosa normal. No purulence, polyps, or crusts.   Oral Cavity/Oropharynx: No mucosal lesions, masses, or pharyngeal asymmetry.    Hypopharynx/Larynx: Voice choppy and hoarse. Indirect mirror laryngoscopy inadequate for exam and/or treatment purposes, please see procedure note for flexible laryngoscopy.   Neck: no cervical adenopathy, no palpable thyroid or salivary gland masses  Thyroid: no significant thyroid abnormality by palpation  Cardiovascular: Good perfusion of upper extremities.  No cyanosis of the hands or fingers.  Lungs: No apparent stridorous breathing. No acute distress.  Skin: Skin warm and dry.  Neurologic: Cranial nerves:  grossly intact.  Psychiatric: Alert and oriented x 3.  Joesph Clubs, PA-C    Procedure:  ENT, [facility] ENT  [address]  [place] [address]    Procedure Note    Name: [name] MRN:  [id number]   Date: 01/15/2024 DOB:  1960-11-29 (63 y.o.)         31579 - LARYNGOSCOPY, FLEXIBLE/RIGID TELESCOPIC, W/ STROBOSCOPY (AMB ONLY)    Performed by: Joshua Console, MD  Authorized by: Joshua Console, MD    Time Out:     Immediately before the procedure, a time out was called:  Yes    Patient verified:  Yes    Procedure Verified:  Yes    Site Verified:  Yes  Documentation:      OTOLARYGOLOGY PROCEDURE NOTE      DATE OF SERVICE: 01/15/2024  PATIENT NAME:Courtney Maynard  Cacho  DATE OF BIRTH: 1960-08-08      Procedure:      Laryngoscopy/Videostroboscopy  Pre/Postoperative diagnosis: Adductor spasmodic dysphonia  Operator:     Console Joshua, MD  Assistant:    Joesph Maynard, APP  Anesthesia:     Topical lidocaine  with epinephrine    Findings:  After nasal anesthesia was obtained with topical lidocaine  with epinephrine, the flexible laryngoscope was passed through the nasal cavity and the larynx viewed.  The glottis was evaluated at various pitches and volumes.  The patient tolerated the procedure well and there were no immediate complications.    The Vocal cords were mobile bilaterally and the airway patent.  There is good adduction on phonation and good abduction on sniff maneuver.      Overall patient had a spasmodic quality to the vocal cords during phonation.  Waves were irregularly irregular with a slightly decreased amplitude.  This however did appear symmetric.  No concerning masses or lesions of the vocal cords.  Overall this was consistent with adductor spasmodic dysphonia.            Honora Molt, MD    Assessment:  Shemeika Starzyk Steuck is a 63 y.o. female with long-standing history of spasmodic dysphonia that is well-managed with Botox  injections roughly every 4 months with outside ENT. Spasmodic dysphonia evident on stroboscopy performed today. Plan to proceed with 1.8 units of botox  for each vocal cord at next botox  clinic.       ICD-10-CM    1. Adductor spasmodic dysphonia  J38.3 31579 - LARYNGOSCOPY, FLEXIBLE/RIGID TELESCOPIC, W/ STROBOSCOPY (AMB ONLY)           Plan:  Orders Placed This Encounter    31579 - LARYNGOSCOPY, FLEXIBLE/RIGID TELESCOPIC, W/ STROBOSCOPY (AMB ONLY)    lidocaine  4 % + phenylephrine  1 % (1:1) nasal solution       - Follow up next available botox  clinic for vocal cord injections    I performed 100% of the history, physical exam, and medical decision making for this encounter.    Honora Molt, MD    Department of Otolaryngology  [facility]    PCP: Delon Daring, MD  [address]  [place] [address]   REF: Self, Referral  No address on file                   [1]   Social History  Tobacco Use   Smoking Status Never   Smokeless Tobacco Never   [2]   Allergies  Allergen Reactions    Crestor [Rosuvastatin]

## 2024-01-15 NOTE — Procedures (Signed)
 ENT, [facility] ENT  [address]  [place] [address]    Procedure Note    Name: Veleka Djordjevic Stotts MRN:  [id number]   Date: 01/15/2024 DOB:  1961/01/28 (63 y.o.)         31579 - LARYNGOSCOPY, FLEXIBLE/RIGID TELESCOPIC, W/ STROBOSCOPY (AMB ONLY)    Performed by: Joshua Console, MD  Authorized by: Joshua Console, MD    Time Out:     Immediately before the procedure, a time out was called:  Yes    Patient verified:  Yes    Procedure Verified:  Yes    Site Verified:  Yes  Documentation:      OTOLARYGOLOGY PROCEDURE NOTE      DATE OF SERVICE: 01/15/2024  PATIENT NAME:Courtney Maynard  DATE OF BIRTH: 1960/12/13      Procedure:      Laryngoscopy/Videostroboscopy  Pre/Postoperative diagnosis: Adductor spasmodic dysphonia  Operator:     Console Joshua, MD  Assistant:    Joesph Clubs, APP  Anesthesia:     Topical lidocaine  with epinephrine    Findings:  After nasal anesthesia was obtained with topical lidocaine  with epinephrine, the flexible laryngoscope was passed through the nasal cavity and the larynx viewed.  The glottis was evaluated at various pitches and volumes.  The patient tolerated the procedure well and there were no immediate complications.    The Vocal cords were mobile bilaterally and the airway patent.  There is good adduction on phonation and good abduction on sniff maneuver.      Overall patient had a spasmodic quality to the vocal cords during phonation.  Waves were irregularly irregular with a slightly decreased amplitude.  This however did appear symmetric.  No concerning masses or lesions of the vocal cords.  Overall this was consistent with adductor spasmodic dysphonia.            Console Joshua, MD

## 2024-02-05 ENCOUNTER — Ambulatory Visit (INDEPENDENT_AMBULATORY_CARE_PROVIDER_SITE_OTHER): Payer: Self-pay | Admitting: OTOLARYNGOLOGY

## 2024-02-06 ENCOUNTER — Other Ambulatory Visit: Payer: Self-pay

## 2024-02-07 ENCOUNTER — Other Ambulatory Visit: Payer: Self-pay

## 2024-02-07 ENCOUNTER — Ambulatory Visit: Payer: Medicare (Managed Care) | Attending: OTOLARYNGOLOGY | Admitting: OTOLARYNGOLOGY

## 2024-02-07 VITALS — Temp 97.5°F | Ht 65.0 in | Wt 193.6 lb

## 2024-02-07 DIAGNOSIS — J383 Other diseases of vocal cords: Secondary | ICD-10-CM

## 2024-02-07 MED ORDER — LIDOCAINE 1 %-EPINEPHRINE 1:100,000 INJECTION SOLUTION
15.0000 mL | Freq: Once | INTRAMUSCULAR | Status: AC
  Administered 2024-02-07: 150 mg via INTRADERMAL

## 2024-02-07 NOTE — Procedures (Signed)
 ENT, PHYSICIAN OFFICE CENTER  [address]  [place] [address]  Operated by [facility]  Procedure Note    Name: Courtney Maynard MRN:  [id number]   Date: 02/07/2024 DOB:  05-08-1960 (64 y.o.)         35382-49- B/L CHEMODENERVATION OF MUSCLE; LARYNX, PERCUTANEOUS W/ GUIDANCE BY NEEDLE EMG (AMB ONLY)    Performed by: Joshua Console, MD  Authorized by: Joshua Console, MD    Time Out:     Immediately before the procedure, a time out was called:  Yes    Patient verified:  Yes    Procedure Verified:  Yes    Site Verified:  Yes  Documentation:        OTOLARYGOLOGY PROCEDURE NOTE      DATE OF SERVICE: 02/07/2024  PATIENT NAME:Courtney Maynard  DATE OF BIRTH: 08-Jun-1960        Procedure:        Laryngeal Botox  Injection    Pre/Postoperative Diagnosis:  Spasmodic Dysphonia    Surgeon:          Console Joshua, MD        Timeout:      Timeout was performed immediately prior to procedure    Consent:      Verbal consent was obtained immediately prior to procedure    Procedure:      Patient was brought into the Botox  Suite and placed in semi-recumbent position.  The head was placed in a slightly extended position.  All landmarks were easily palpated and the skin was prepped with alcohol. 1% lidocaine  with 1:100,000 epinephrine  was injected into the skin overlying the cricothyroid membrane and into the subglottic airway.  Using a 27-gauge Teflon coated needle under EMG guidance, 1.75 units of Botox  in 0.07 mL were injected into the right vocal cord.  Good EMG signal was obtained.  Next, 2.5 Units of Botox  in 0.10 mL were injected into the right vocal cord.  Again, good EMG signal was obtained.  The patient tolerated the procedure well.   All discharge instructions were thoroughly explained to the patient and all questions answered.    See nursing notes for Expiration date and Lot #.    Console Joshua, MD  02/07/2024, 09:24                   Console Joshua, MD

## 2024-02-07 NOTE — Progress Notes (Signed)
 ENT, [facility]  [place] [address]  [phone]    PATIENT NAME:  Courtney Maynard  MRN:  [id number]  DOB:  31-Jul-1960  DATE OF SERVICE: 02/07/2024    Chief Complaint:  Botox  Injection    HPI:  Courtney Maynard is a 64 y.o. female who is an established patient that has been seen in clinic within the last 3 years. Courtney Maynard returns today for Botox  vocal cord injections for her long-standing history spasmodic dysphonia. Patient typically follows in [place] for bilateral Botox  injections to vocal cords every 4 months. Her last Botox  injection was in May/June 2025. She has been doing Botox  injections for 20+ years. She was last seen in clinic on 01/15/24, and since then, she endorses continued interest in proceeding with injections today.         Review of Information:  Procedure note from 04/18/23:  2.5 units on the right with 1.8 units on the left    Previously Reviewed Information:  N/a    Past Medical History:  Past Medical History:   Diagnosis Date    Anemia, unspecified     Anxiety     Bronchitis     Cardiac murmur     Coronary artery disease involving native coronary artery of native heart without angina pectoris 12/20/2021    Essential hypertension     Mixed hyperlipidemia     Spasmodic dysphonia     Vitamin D deficiency            Medications:  Outpatient Medications Marked as Taking for the 02/07/24 encounter (Procedure visit) with Joshua Console, MD   Medication Sig    ascorbic acid, vitamin C, (VITAMIN C) 500 mg Oral Tablet Take 1 Tablet (500 mg total) by mouth Once a day    aspirin (ECOTRIN) 81 mg Oral Tablet, Delayed Release (E.C.) Take 1 Tablet (81 mg total) by mouth Once a day    fenofibrate nanocrystallized (TRICOR) 145 mg Oral Tablet Take 1 Tablet (145 mg total) by mouth Every morning with breakfast    ferrous fumarate/vit Bcomp,C (SUPER B COMPLEX ORAL) Take 1 Tablet by mouth Once a day    krill-om-3-dha-epa-phospho-ast (KRILL OIL) 1,000-170-50-80 mg Oral Capsule  Take 1 Capsule by mouth Once a day    montelukast (SINGULAIR) 10 mg Oral Tablet Take 1 Tablet (10 mg total) by mouth Every evening    Nadolol  (CORGARD ) 20 mg Oral Tablet Take 1 Tablet (20 mg total) by mouth Once a day    nitroGLYCERIN  (NITROSTAT ) 0.4 mg Sublingual Tablet, Sublingual Place 1 Tablet (0.4 mg total) under the tongue Every 5 minutes as needed for Chest pain for 3 doses over 15 minutes    oxyBUTYnin (DITROPAN) 5 mg Oral Tablet Take 1 Tablet (5 mg total) by mouth Three times a day    traZODone (DESYREL) 50 mg Oral Tablet Take 1 Tablet (50 mg total) by mouth Every night    valsartan  (DIOVAN ) 80 mg Oral Tablet Take 1 Tablet (80 mg total) by mouth Once a day       Review of Systems:                                                        Pertinent ROS per HPI.   Denies fever/chills, SOB, and chest  pain.   All other systems reviewed and found to be negative.    Physical Exam:  Temperature 36.4 C (97.5 F), height 1.651 m (5' 5), weight 87.8 kg (193 lb 9 oz).  Body mass index is 32.21 kg/m.  General Appearance: Pleasant, cooperative, healthy, and in no acute distress.  Eyes: Conjunctivae/corneas clear, pupils equal and round, EOM's intact.  Head and Face: Normocephalic, atraumatic.  Face symmetric, no obvious lesions.   Ears: Pinnae: Normal shape and position.   Nose: External pyramid midline.   Hypopharynx/Larynx: Voice spasmodic.See procedure note.   Neck: no cervical adenopathy, no palpable thyroid or salivary gland masses  Thyroid: no significant thyroid abnormality by palpation  Cardiovascular: Good perfusion of upper extremities.  No cyanosis of the hands or fingers.  Lungs: No apparent stridorous breathing. No acute distress.  Skin: Skin warm and dry.  Neurologic: Cranial nerves:  grossly intact.  Psychiatric: Alert and oriented x 3.    Procedure:  ENT, PHYSICIAN OFFICE CENTER  [address]  [place] [address]  Operated by [facility]  Procedure Note    Name: Courtney Maynard MRN:   [id number]   Date: 02/07/2024 DOB:  [birth year] (64 y.o.)         35382-49- B/L CHEMODENERVATION OF MUSCLE; LARYNX, PERCUTANEOUS W/ GUIDANCE BY NEEDLE EMG (AMB ONLY)    Performed by: Joshua Console, MD  Authorized by: Joshua Console, MD    Time Out:     Immediately before the procedure, a time out was called:  Yes    Patient verified:  Yes    Procedure Verified:  Yes    Site Verified:  Yes  Documentation:        OTOLARYGOLOGY PROCEDURE NOTE      DATE OF SERVICE: 02/07/2024  PATIENT NAME:Courtney Maynard  DATE OF BIRTH: 10/20/1960        Procedure:        Laryngeal Botox  Injection    Pre/Postoperative Diagnosis:  Spasmodic Dysphonia    Surgeon:          Console Joshua, MD        Timeout:      Timeout was performed immediately prior to procedure    Consent:      Verbal consent was obtained immediately prior to procedure    Procedure:      Patient was brought into the Botox  Suite and placed in semi-recumbent position.  The head was placed in a slightly extended position.  All landmarks were easily palpated and the skin was prepped with alcohol. 1% lidocaine  with 1:100,000 epinephrine  was injected into the skin overlying the cricothyroid membrane and into the subglottic airway.  Using a 27-gauge Teflon coated needle under EMG guidance, 1.75 units of Botox  in 0.07 mL were injected into the right vocal cord.  Good EMG signal was obtained.  Next, 2.5 Units of Botox  in 0.10 mL were injected into the right vocal cord.  Again, good EMG signal was obtained.  The patient tolerated the procedure well.   All discharge instructions were thoroughly explained to the patient and all questions answered.    See nursing notes for Expiration date and Lot #.    Console Joshua, MD  02/07/2024, 09:24                   Console Joshua, MD    Assessment:  Courtney Maynard is a 64 y.o. female who presents for Botox  vocal cord injections for her long-standing history spasmodic  dysphonia. Patient typically follows in [place] for bilateral Botox  injections to  vocal cords every 4 months. Her last Botox  injection was in May/June 2025. She has been doing Botox  injections for 20+ years. She was last seen in clinic on 01/15/24, and since then, she endorses continued interest in proceeding with injections today. Discussed the details, alternatives, benefits, and risks of hematoma, breathy voice, swallowing deficits. Consent obtained today. 1.75 units on the left side and 2.5 units were injected on the right, and the patient tolerated this well. Advised to reach out if she does not notice improvement after 4 weeks, and we can consider a scope exam at that time. Follow-up in 4 months for repeat injections, or sooner if needed.        ICD-10-CM    1. Adductor spasmodic dysphonia  J38.3 [phone]- B/L CHEMODENERVATION OF MUSCLE; LARYNX, PERCUTANEOUS W/ GUIDANCE BY NEEDLE EMG (AMB ONLY)           Plan:  Orders Placed This Encounter    35382-49- B/L CHEMODENERVATION OF MUSCLE; LARYNX, PERCUTANEOUS W/ GUIDANCE BY NEEDLE EMG (AMB ONLY)    lidocaine  1%-EPINEPHrine  1:100,000 injection     -Follow-up in 4 months for repeat injections, or sooner if needed      Honora Molt, MD  Department of Otolaryngology  [facility]    PCP: Delon Daring, MD  [facility] [address] [facility] [address]  [place] [address]   REF: Molt Honora, MD  [address]  [place],  [place] [address]     I am scribing for, and in the presence of, Dr. Honora Molt, for services provided on 02/07/2024.  Cammi Kovach, SCRIBE    // Cammi Kovach, SCRIBE  02/07/2024, 09:12    .tgscribe AND sign      I personally performed the services described in this documentation, as scribed  in my presence, and it is both accurate  and complete.    Honora Molt, MD

## 2024-02-07 NOTE — Nursing Note (Signed)
 02/07/24 1100 02/07/24 1108   Medication Administration   Initials GJ GJ   Witness Initials jcs jcs   ENT Medications Botox  NDC#[id number] Botox  NDC#0002-31145-01   Medication Strength 100 units/vial 100 units/vial   Medication Dose 1.75 units 2.5 units   Route of Administration IM IM   Site Left TA (thyroarytenoid) Right TA (thyroarytenoid)   LOT # [id number]   Expiration date 01/29/26 01/29/26   Manufacturer Allergan Allergan   Clinic Supplied Yes Yes   Patient Supplied No No   Comments: Pt tolerated procedure well with no complaints. Pt tolerated procedure well with no complaints.

## 2024-02-20 ENCOUNTER — Encounter (INDEPENDENT_AMBULATORY_CARE_PROVIDER_SITE_OTHER): Payer: Self-pay

## 2024-04-07 ENCOUNTER — Encounter (INDEPENDENT_AMBULATORY_CARE_PROVIDER_SITE_OTHER): Payer: Medicare (Managed Care)

## 2024-07-10 ENCOUNTER — Ambulatory Visit (INDEPENDENT_AMBULATORY_CARE_PROVIDER_SITE_OTHER): Payer: Self-pay | Admitting: OTOLARYNGOLOGY
